# Patient Record
Sex: Female | Born: 1975 | Race: White | Hispanic: No | Marital: Married | Smoking: Current some day smoker
Health system: Southern US, Community
[De-identification: ages and names within clinical notes are randomized; demographics above are authoritative.]

## PROBLEM LIST (undated history)

## (undated) DIAGNOSIS — E669 Obesity, unspecified: Secondary | ICD-10-CM

## (undated) DIAGNOSIS — Z9109 Other allergy status, other than to drugs and biological substances: Secondary | ICD-10-CM

## (undated) DIAGNOSIS — E119 Type 2 diabetes mellitus without complications: Secondary | ICD-10-CM

## (undated) DIAGNOSIS — E785 Hyperlipidemia, unspecified: Secondary | ICD-10-CM

## (undated) DIAGNOSIS — F319 Bipolar disorder, unspecified: Secondary | ICD-10-CM

## (undated) DIAGNOSIS — F419 Anxiety disorder, unspecified: Secondary | ICD-10-CM

## (undated) DIAGNOSIS — Z87442 Personal history of urinary calculi: Secondary | ICD-10-CM

## (undated) DIAGNOSIS — F3181 Bipolar II disorder: Secondary | ICD-10-CM

## (undated) DIAGNOSIS — G5600 Carpal tunnel syndrome, unspecified upper limb: Secondary | ICD-10-CM

## (undated) DIAGNOSIS — C539 Malignant neoplasm of cervix uteri, unspecified: Secondary | ICD-10-CM

## (undated) DIAGNOSIS — K219 Gastro-esophageal reflux disease without esophagitis: Secondary | ICD-10-CM

## (undated) DIAGNOSIS — M1990 Unspecified osteoarthritis, unspecified site: Secondary | ICD-10-CM

## (undated) DIAGNOSIS — J189 Pneumonia, unspecified organism: Secondary | ICD-10-CM

## (undated) DIAGNOSIS — G4730 Sleep apnea, unspecified: Secondary | ICD-10-CM

## (undated) HISTORY — PX: WISDOM TOOTH EXTRACTION: SHX21

## (undated) HISTORY — PX: DILATION AND CURETTAGE OF UTERUS: SHX78

## (undated) HISTORY — PX: ABDOMINAL HYSTERECTOMY: SHX81

## (undated) HISTORY — PX: TONSILLECTOMY: SUR1361

---

## 2006-05-11 ENCOUNTER — Emergency Department: Payer: Self-pay | Admitting: Emergency Medicine

## 2006-08-24 ENCOUNTER — Emergency Department: Payer: Self-pay | Admitting: Emergency Medicine

## 2006-12-13 ENCOUNTER — Emergency Department: Payer: Self-pay | Admitting: Emergency Medicine

## 2007-01-20 ENCOUNTER — Emergency Department: Payer: Self-pay | Admitting: Emergency Medicine

## 2007-08-17 ENCOUNTER — Emergency Department: Payer: Self-pay | Admitting: Unknown Physician Specialty

## 2007-08-17 ENCOUNTER — Other Ambulatory Visit: Payer: Self-pay

## 2007-08-25 ENCOUNTER — Emergency Department: Payer: Self-pay | Admitting: Emergency Medicine

## 2007-09-15 ENCOUNTER — Ambulatory Visit: Payer: Self-pay | Admitting: Unknown Physician Specialty

## 2007-10-05 ENCOUNTER — Emergency Department: Payer: Self-pay | Admitting: Internal Medicine

## 2007-10-26 ENCOUNTER — Emergency Department: Payer: Self-pay | Admitting: Emergency Medicine

## 2007-11-10 ENCOUNTER — Ambulatory Visit: Payer: Self-pay | Admitting: Otolaryngology

## 2008-01-27 ENCOUNTER — Ambulatory Visit: Payer: Self-pay | Admitting: Rheumatology

## 2008-02-03 ENCOUNTER — Encounter: Payer: Self-pay | Admitting: Rheumatology

## 2008-05-17 ENCOUNTER — Emergency Department: Payer: Self-pay | Admitting: Emergency Medicine

## 2008-06-04 ENCOUNTER — Emergency Department: Payer: Self-pay | Admitting: Emergency Medicine

## 2008-08-04 ENCOUNTER — Ambulatory Visit: Payer: Self-pay | Admitting: Gastroenterology

## 2008-11-18 ENCOUNTER — Emergency Department: Payer: Self-pay | Admitting: Emergency Medicine

## 2009-01-20 ENCOUNTER — Emergency Department: Payer: Self-pay | Admitting: Emergency Medicine

## 2009-05-14 ENCOUNTER — Emergency Department: Payer: Self-pay | Admitting: Emergency Medicine

## 2009-06-06 ENCOUNTER — Ambulatory Visit: Payer: Self-pay | Admitting: Family Medicine

## 2009-10-20 ENCOUNTER — Emergency Department: Payer: Self-pay | Admitting: Unknown Physician Specialty

## 2011-03-30 ENCOUNTER — Emergency Department: Payer: Self-pay | Admitting: Emergency Medicine

## 2012-01-19 ENCOUNTER — Emergency Department: Payer: Self-pay | Admitting: Unknown Physician Specialty

## 2012-01-19 LAB — CBC
HCT: 48 % — ABNORMAL HIGH (ref 35.0–47.0)
HGB: 16.5 g/dL — ABNORMAL HIGH (ref 12.0–16.0)
MCH: 31.6 pg (ref 26.0–34.0)
MCHC: 34.3 g/dL (ref 32.0–36.0)
Platelet: 233 10*3/uL (ref 150–440)
RBC: 5.2 10*6/uL (ref 3.80–5.20)
RDW: 13.4 % (ref 11.5–14.5)

## 2012-01-19 LAB — PROTIME-INR
INR: 0.9
Prothrombin Time: 12.6 secs (ref 11.5–14.7)

## 2012-01-23 ENCOUNTER — Ambulatory Visit: Payer: Self-pay | Admitting: Family Medicine

## 2012-06-11 ENCOUNTER — Emergency Department: Payer: Self-pay | Admitting: Emergency Medicine

## 2012-10-20 ENCOUNTER — Ambulatory Visit: Payer: Self-pay | Admitting: Neurology

## 2012-10-28 ENCOUNTER — Ambulatory Visit: Payer: Self-pay | Admitting: Neurology

## 2013-04-02 ENCOUNTER — Ambulatory Visit: Payer: Self-pay | Admitting: Orthopedic Surgery

## 2013-04-03 HISTORY — PX: CARPAL TUNNEL RELEASE: SHX101

## 2013-09-29 ENCOUNTER — Ambulatory Visit: Payer: Self-pay | Admitting: Orthopedic Surgery

## 2014-03-12 ENCOUNTER — Emergency Department: Payer: Self-pay | Admitting: Emergency Medicine

## 2014-05-20 DIAGNOSIS — G5600 Carpal tunnel syndrome, unspecified upper limb: Secondary | ICD-10-CM | POA: Insufficient documentation

## 2014-05-20 DIAGNOSIS — G4733 Obstructive sleep apnea (adult) (pediatric): Secondary | ICD-10-CM | POA: Insufficient documentation

## 2014-05-20 DIAGNOSIS — E119 Type 2 diabetes mellitus without complications: Secondary | ICD-10-CM | POA: Insufficient documentation

## 2014-07-03 ENCOUNTER — Emergency Department: Payer: Self-pay | Admitting: Emergency Medicine

## 2015-04-22 NOTE — Op Note (Signed)
PATIENT NAME:  Monica Dominguez, Monica Dominguez MR#:  [id number] DATE OF BIRTH:  07-Aug-1976  DATE OF PROCEDURE:  04/02/2013  PREOPERATIVE DIAGNOSIS:  Right carpal tunnel syndrome.   POSTOPERATIVE DIAGNOSIS:  Right carpal tunnel syndrome.   PROCEDURE:  Right carpal tunnel release.   ANESTHESIA:  General.  SURGEON:  Laurene Footman, M.D.   DESCRIPTION OF PROCEDURE:  The patient was brought to the operating room, and after adequate anesthesia was obtained, the right arm was prepped and draped in the usual sterile fashion with a tourniquet applied to the upper arm. After patient identification and timeout procedures were completed, the tourniquet raised to 250 mmHg. Approximately a 2 cm incision was made in line with the ring metacarpal, and the skin and subcutaneous tissue were spread. Transcarpal ligament was identified. There was prominent thenar musculature overlying this transcarpal ligament. A small nick was made into the transcarpal ligament, and a hemostat placed to protect the underlying structures. Release was carried out distally and then proximally through the ligament and with direct visualization of the nerve. The nerve was noted to be compressed at the level of proximal wrist flexion crease. After release, there was good vascular blush. There were no masses, and there was mild flexor tenosynovitis. The wrist was then irrigated and closed with simple interrupted 4-0 nylon. Xeroform, 4 x 4's, Webril and Ace wrap applied.   SPECIMEN:  None.  COMPLICATIONS:  None.  ESTIMATED BLOOD LOSS:  Minimal.   Tourniquet time was 11 minutes at 250 mmHg.   ____________________________ Laurene Footman, MD mjm:dmm D: 04/02/2013 22:30:15 ET T: 04/02/2013 23:05:49 ET JOB#: [id number]  cc: Laurene Footman, MD, <Dictator> Laurene Footman MD ELECTRONICALLY SIGNED 04/03/2013 8:25

## 2015-04-22 NOTE — Op Note (Signed)
PATIENT NAME:  Monica Dominguez, Monica Dominguez MR#:  [id number] DATE OF BIRTH:  [birth year]  DATE OF PROCEDURE:  09/29/2013  PREOPERATIVE DIAGNOSIS:  Right de Quervain's stenosing tenosynovitis.  POSTOPERATIVE DIAGNOSIS:  Right de Quervain's stenosing tenosynovitis.   PROCEDURE:  Harriet Pho release, right wrist.   ANESTHESIA:  General.   SURGEON:  Laurene Footman, MD   DESCRIPTION OF PROCEDURE:  The patient was brought to the operating room, and after adequate anesthesia was obtained, the right arm was prepped and draped in the usual sterile fashion with a tourniquet applied to the upper arm. After patient identification and timeout procedures were completed, the tourniquet was raised to 250 mmHg. A small transverse incision was made in line with the wrist crease at the level of the styloid. The superficial skin was incised, and the subcutaneous tissue was spread preserving the subcutaneous nerves. The area of compression was identified, and this was opened with a deep knife, followed by scissors. Multiple slips of the tendons were identified, and a complete release was carried out proximally and distally without subluxation of the tendons. There was a significant amount of fluid on initially opening up de Quervain's canal. Following this release, the thumb was placed through a range of motion. Tendons were intact, and on palpation, there was no bony spur palpable. The wound was then closed using simple interrupted 4-0 nylon skin sutures with 10 mL 0.5% Sensorcaine infiltrated proximal to the incision for postoperative analgesia. A sterile compressive dressing with Xeroform, 4 x 4's, Webril, and Ace wrap was applied and the tourniquet let down.   TOURNIQUET TIME:  9 minutes at 250 mmHg.    COMPLICATIONS:  None.   SPECIMEN:  None.   ESTIMATED BLOOD LOSS:  Minimal.      ____________________________ Laurene Footman, MD mjm:ms D: 09/29/2013 21:38:45 ET T: 09/29/2013 22:20:58  ET JOB#: [id number]  cc: Laurene Footman, MD, <Dictator> Laurene Footman MD ELECTRONICALLY SIGNED 09/30/2013 7:57

## 2015-06-19 ENCOUNTER — Emergency Department
Admission: EM | Admit: 2015-06-19 | Discharge: 2015-06-19 | Disposition: A | Discharge status: Home / Self Care | Payer: Medicaid Other | Attending: Emergency Medicine | Admitting: Emergency Medicine

## 2015-06-19 ENCOUNTER — Encounter: Payer: Self-pay | Admitting: Emergency Medicine

## 2015-06-19 DIAGNOSIS — Z87891 Personal history of nicotine dependence: Secondary | ICD-10-CM | POA: Diagnosis not present

## 2015-06-19 DIAGNOSIS — R112 Nausea with vomiting, unspecified: Secondary | ICD-10-CM | POA: Insufficient documentation

## 2015-06-19 DIAGNOSIS — Z79899 Other long term (current) drug therapy: Secondary | ICD-10-CM | POA: Insufficient documentation

## 2015-06-19 DIAGNOSIS — M545 Low back pain, unspecified: Secondary | ICD-10-CM

## 2015-06-19 HISTORY — DX: Hyperlipidemia, unspecified: E78.5

## 2015-06-19 HISTORY — DX: Gastro-esophageal reflux disease without esophagitis: K21.9

## 2015-06-19 HISTORY — DX: Other allergy status, other than to drugs and biological substances: Z91.09

## 2015-06-19 LAB — URINALYSIS COMPLETE WITH MICROSCOPIC (ARMC ONLY)
BACTERIA UA: NONE SEEN
Bilirubin Urine: NEGATIVE
GLUCOSE, UA: 50 mg/dL — AB
Hgb urine dipstick: NEGATIVE
Leukocytes, UA: NEGATIVE
Nitrite: NEGATIVE
Protein, ur: 30 mg/dL — AB
Specific Gravity, Urine: 1.031 — ABNORMAL HIGH (ref 1.005–1.030)
pH: 6 (ref 5.0–8.0)

## 2015-06-19 MED ORDER — KETOROLAC TROMETHAMINE 60 MG/2ML IM SOLN
INTRAMUSCULAR | Status: AC
Start: 2015-06-19 — End: 2015-06-19
  Administered 2015-06-19: 60 mg via INTRAMUSCULAR
  Filled 2015-06-19: qty 2

## 2015-06-19 MED ORDER — KETOROLAC TROMETHAMINE 10 MG PO TABS: 10.0000 mg | ORAL_TABLET | Freq: Three times a day (TID) | ORAL | Status: DC

## 2015-06-19 MED ORDER — ONDANSETRON HCL 4 MG PO TABS: 4.0000 mg | ORAL_TABLET | Freq: Four times a day (QID) | ORAL | Status: DC | PRN

## 2015-06-19 MED ORDER — KETOROLAC TROMETHAMINE 60 MG/2ML IM SOLN
60.0000 mg | Freq: Once | INTRAMUSCULAR | Status: AC
  Administered 2015-06-19: 60 mg via INTRAMUSCULAR

## 2015-06-19 MED ORDER — ORPHENADRINE CITRATE 30 MG/ML IJ SOLN
60.0000 mg | INTRAMUSCULAR | Status: AC
  Administered 2015-06-19: 60 mg via INTRAMUSCULAR

## 2015-06-19 MED ORDER — ORPHENADRINE CITRATE 30 MG/ML IJ SOLN
INTRAMUSCULAR | Status: AC
  Administered 2015-06-19: 60 mg via INTRAMUSCULAR
  Filled 2015-06-19: qty 2

## 2015-06-19 NOTE — Discharge Instructions (Signed)
Back Pain, Adult Back pain is very common. The pain often gets better over time. The cause of back pain is usually not dangerous. Most people can learn to manage their back pain on their own.  HOME CARE   Stay active. Start with short walks on flat ground if you can. Try to walk farther each day.  Do not sit, drive, or stand in one place for more than 30 minutes. Do not stay in bed.  Do not avoid exercise or work. Activity can help your back heal faster.  Be careful when you bend or lift an object. Bend at your knees, keep the object close to you, and do not twist.  Sleep on a firm mattress. Lie on your side, and bend your knees. If you lie on your back, put a pillow under your knees.  Only take medicines as told by your doctor.  Put ice on the injured area.  Put ice in a plastic bag.  Place a towel between your skin and the bag.  Leave the ice on for 15-20 minutes, 03-04 times a day for the first 2 to 3 days. After that, you can switch between ice and heat packs.  Ask your doctor about back exercises or massage.  Avoid feeling anxious or stressed. Find good ways to deal with stress, such as exercise. GET HELP RIGHT AWAY IF:   Your pain does not go away with rest or medicine.  Your pain does not go away in 1 week.  You have new problems.  You do not feel well.  The pain spreads into your legs.  You cannot control when you poop (bowel movement) or pee (urinate).  Your arms or legs feel weak or lose feeling (numbness).  You feel sick to your stomach (nauseous) or throw up (vomit).  You have belly (abdominal) pain.  You feel like you may pass out (faint). MAKE SURE YOU:   Understand these instructions.  Will watch your condition.  Will get help right away if you are not doing well or get worse. Document Released: 06/04/2008 Document Revised: 03/10/2012 Document Reviewed: 04/20/2014 [facility] Patient Information 2015 [place], [place]. This information is not intended  to replace advice given to you by your health care provider. Make sure you discuss any questions you have with your health care provider.  Take the prescription meds as directed.  Follow-up with your provider as scheduled.  Apply ice to the back for control of symptoms. Return to the ED as discussed for increased abdominal pain, bloody urine, or inability to pass urine.

## 2015-06-19 NOTE — ED Notes (Signed)
Pt reports right lower back pain that goes around to groin that started yesterday. Denies urinary complaints Pt states she thought it was muscular but no relief Nsaids or Tylenol. Pt states she has just started on Pravastatin and Amoxicillin [place] for ear infection.

## 2015-06-19 NOTE — ED Provider Notes (Signed)
[facility] Emergency Department Provider Note ____________________________________________  Time seen: 1610  I have reviewed the triage vital signs and the nursing notes.  HISTORY  Chief Complaint  Back Pain  HPI Monica Dominguez is a 39 y.o. female one day complaint of right lower back pain with referral to the groin. She denies any urinary symptoms including dysuria, hematuria or incontinence. She denies relief with over-the-counter anti-inflammatories or Tylenol. She is currently taking amoxicillin for a ear infection and she denies any fever, chills, sweats, or diarrhea.She gives a remote history of kidney stones, more than 20 years ago.  Past Medical History  Diagnosis Date  . Hyperlipemia   . Environmental allergies   . GERD (gastroesophageal reflux disease)    There are no active problems to display for this patient.  Past Surgical History  Procedure Laterality Date  . Abdominal hysterectomy    . Tonsillectomy     Current Outpatient Rx  Name  Route  Sig  Dispense  Refill  . gabapentin (NEURONTIN) 300 MG capsule   Oral   Take 300 mg by mouth 3 (three) times daily.         . pravastatin (PRAVACHOL) 40 MG tablet   Oral   Take 40 mg by mouth daily.         Marland Kitchen ketorolac (TORADOL) 10 MG tablet   Oral   Take 1 tablet (10 mg total) by mouth every 8 (eight) hours.   15 tablet   0   . ondansetron (ZOFRAN) 4 MG tablet   Oral   Take 1 tablet (4 mg total) by mouth every 6 (six) hours as needed for nausea or vomiting.   15 tablet   0     Allergies Review of patient's allergies indicates no known allergies.  History reviewed. No pertinent family history.  Social History History  Substance Use Topics  . Smoking status: Former Research scientist (life sciences)  . Smokeless tobacco: Never Used  . Alcohol Use: No   Review of Systems  Constitutional: Negative for fever. Eyes: Negative for visual changes. ENT: Negative for sore throat. Cardiovascular: Negative  for chest pain. Respiratory: Negative for shortness of breath. Gastrointestinal: Reports one episode of n/v this morning. Negative for abdominal pain, constipation, and diarrhea. Genitourinary: Negative for dysuria, hematuria, retention, or urgency. Musculoskeletal: Positive for back pain. Skin: Negative for rash. Neurological: Negative for headaches, focal weakness or numbness. ____________________________________________  PHYSICAL EXAM:  VITAL SIGNS: ED Triage Vitals  Enc Vitals Group     BP 06/19/15 1355 156/94 mmHg     Pulse Rate 06/19/15 1355 102     Resp --      Temp 06/19/15 1355 98.4 F (36.9 C)     Temp Source 06/19/15 1355 Oral     SpO2 06/19/15 1355 95 %     Weight 06/19/15 1355 254 lb (115.214 kg)     Height 06/19/15 1355 5' (1.524 m)     Head Cir --      Peak Flow --      Pain Score 06/19/15 1410 10     Pain Loc --      Pain Edu? --      Excl. in [place]? --    Constitutional: Alert and oriented. Well appearing and in no distress. HEENT: Normocephalic and atraumatic.Conjunctivae are normal. PERRL. Normal extraocular movements. Cardiovascular: Normal rate, regular rhythm.  Respiratory: Normal respiratory effort. No wheezes/rales/rhonchi. Gastrointestinal: Soft and nontender. No distention. No rebound or guarding. Mild CVA tenderness on the  right.  Musculoskeletal: Nontender with normal range of motion in all extremities. Normal spinal inspection without deformity, spasm, or step-off.  Tender to light touch over the right LS region. Normal lumbar and Neurologic:  Normal gait without ataxia. Normal speech and language. No gross focal neurologic deficits are appreciated. Skin:  Skin is warm, dry and intact. No rash noted. Psychiatric: Mood and affect are normal. Patient exhibits appropriate insight and judgment. ____________________________________________    LABS (pertinent positives/negatives) Labs Reviewed  URINALYSIS COMPLETEWITH MICROSCOPIC ([place] ONLY) - Abnormal;  Notable for the following:    Color, Urine YELLOW (*)    APPearance CLEAR (*)    Glucose, UA 50 (*)    Ketones, ur TRACE (*)    Specific Gravity, Urine 1.031 (*)    Protein, ur 30 (*)    Squamous Epithelial / LPF 0-5 (*)    All other components within normal limits  ____________________________________________   RADIOLOGY deferred ____________________________________________  PROCEDURES  Toradol 60 mg IM Norflex 60 mg IM ____________________________________________  INITIAL IMPRESSION / ASSESSMENT AND PLAN / ED COURSE  Acute lower back pain that is reproducible on exam.  Discussed lack of clinical evidence supporting need to scan for kidney stones, due to normal urine, no urinary symptoms, and no other acute abdominal signs on exam.  Will defer imaging and treat acute pain with Toradol and Zofran.  Refer to primary provider as scheduled. Return to the ED for frank hematuria, dysuria, retention, or worsening back pain. ____________________________________________  FINAL CLINICAL IMPRESSION(S) / ED DIAGNOSES  Final diagnoses:  Right-sided low back pain without sciatica     Melvenia Needles, PA-C 06/19/15 [place], MD 06/19/15 [phone]

## 2015-12-22 ENCOUNTER — Other Ambulatory Visit: Payer: Self-pay | Admitting: Preventative Medicine

## 2015-12-22 DIAGNOSIS — Z1231 Encounter for screening mammogram for malignant neoplasm of breast: Secondary | ICD-10-CM

## 2016-01-05 ENCOUNTER — Ambulatory Visit: Payer: Medicaid Other | Attending: Preventative Medicine

## 2016-02-20 ENCOUNTER — Encounter (HOSPITAL_COMMUNITY): Payer: Self-pay

## 2016-02-20 ENCOUNTER — Emergency Department (HOSPITAL_COMMUNITY)
Admission: EM | Admit: 2016-02-20 | Discharge: 2016-02-20 | Disposition: A | Discharge status: Home / Self Care | Payer: Medicaid Other | Attending: Emergency Medicine | Admitting: Emergency Medicine

## 2016-02-20 DIAGNOSIS — R0602 Shortness of breath: Secondary | ICD-10-CM | POA: Diagnosis not present

## 2016-02-20 DIAGNOSIS — R42 Dizziness and giddiness: Secondary | ICD-10-CM | POA: Insufficient documentation

## 2016-02-20 DIAGNOSIS — R002 Palpitations: Secondary | ICD-10-CM

## 2016-02-20 DIAGNOSIS — R1110 Vomiting, unspecified: Secondary | ICD-10-CM | POA: Diagnosis not present

## 2016-02-20 DIAGNOSIS — Z87891 Personal history of nicotine dependence: Secondary | ICD-10-CM | POA: Insufficient documentation

## 2016-02-20 DIAGNOSIS — E119 Type 2 diabetes mellitus without complications: Secondary | ICD-10-CM | POA: Diagnosis not present

## 2016-02-20 DIAGNOSIS — K219 Gastro-esophageal reflux disease without esophagitis: Secondary | ICD-10-CM | POA: Diagnosis not present

## 2016-02-20 DIAGNOSIS — R12 Heartburn: Secondary | ICD-10-CM | POA: Insufficient documentation

## 2016-02-20 DIAGNOSIS — E785 Hyperlipidemia, unspecified: Secondary | ICD-10-CM | POA: Insufficient documentation

## 2016-02-20 DIAGNOSIS — R55 Syncope and collapse: Secondary | ICD-10-CM | POA: Insufficient documentation

## 2016-02-20 DIAGNOSIS — R000 Tachycardia, unspecified: Secondary | ICD-10-CM | POA: Diagnosis present

## 2016-02-20 DIAGNOSIS — Z79899 Other long term (current) drug therapy: Secondary | ICD-10-CM | POA: Diagnosis not present

## 2016-02-20 HISTORY — DX: Type 2 diabetes mellitus without complications: E11.9

## 2016-02-20 LAB — CBC WITH DIFFERENTIAL/PLATELET
BASOS ABS: 0.1 10*3/uL (ref 0.0–0.1)
Basophils Relative: 1 %
Eosinophils Absolute: 0.2 10*3/uL (ref 0.0–0.7)
Eosinophils Relative: 2 %
HEMATOCRIT: 44.8 % (ref 36.0–46.0)
Hemoglobin: 15.6 g/dL — ABNORMAL HIGH (ref 12.0–15.0)
LYMPHS PCT: 41 %
Lymphs Abs: 3.7 10*3/uL (ref 0.7–4.0)
MCH: 31.7 pg (ref 26.0–34.0)
MCHC: 34.8 g/dL (ref 30.0–36.0)
MCV: 91.1 fL (ref 78.0–100.0)
Monocytes Absolute: 0.8 10*3/uL (ref 0.1–1.0)
Monocytes Relative: 9 %
NEUTROS ABS: 4.3 10*3/uL (ref 1.7–7.7)
Neutrophils Relative %: 47 %
PLATELETS: 204 10*3/uL (ref 150–400)
RBC: 4.92 MIL/uL (ref 3.87–5.11)
RDW: 13.3 % (ref 11.5–15.5)
WBC: 8.9 10*3/uL (ref 4.0–10.5)

## 2016-02-20 LAB — BASIC METABOLIC PANEL
ANION GAP: 9 (ref 5–15)
BUN: 14 mg/dL (ref 6–20)
CO2: 25 mmol/L (ref 22–32)
Calcium: 9 mg/dL (ref 8.9–10.3)
Chloride: 109 mmol/L (ref 101–111)
Creatinine, Ser: 1.1 mg/dL — ABNORMAL HIGH (ref 0.44–1.00)
GFR calc Af Amer: >60 mL/min (ref >60)
Glucose, Bld: 145 mg/dL — ABNORMAL HIGH (ref 65–99)
POTASSIUM: 3.7 mmol/L (ref 3.5–5.1)
SODIUM: 143 mmol/L (ref 135–145)

## 2016-02-20 LAB — TROPONIN I: Troponin I: <0.03 ng/mL (ref <0.031)

## 2016-02-20 MED ORDER — ONDANSETRON HCL 4 MG/2ML IJ SOLN
4.0000 mg | Freq: Once | INTRAMUSCULAR | Status: AC
  Administered 2016-02-20: 4 mg via INTRAVENOUS
  Filled 2016-02-20: qty 2

## 2016-02-20 MED ORDER — SODIUM CHLORIDE 0.9 % IV BOLUS (SEPSIS)
1000.0000 mL | Freq: Once | INTRAVENOUS | Status: AC
  Administered 2016-02-20: 1000 mL via INTRAVENOUS

## 2016-02-20 NOTE — ED Notes (Signed)
Patient ambulated around nurses station without difficulty. Voices no complaints.

## 2016-02-20 NOTE — ED Notes (Signed)
Pt states she was walking around at work and became dizzy and her toes on her left foot was tingling. Pt was vomiting on arrival to ED.

## 2016-02-20 NOTE — Discharge Instructions (Signed)
Palpitations  A palpitation is the feeling that your heartbeat is irregular or is faster than normal. It may feel like your heart is fluttering or skipping a beat. Palpitations are usually not a serious problem. However, in some cases, you may need further medical evaluation.  CAUSES   Palpitations can be caused by:  · Smoking.  · Caffeine or other stimulants, such as diet pills or energy drinks.  · Alcohol.  · Stress and anxiety.  · Strenuous physical activity.  · Fatigue.  · Certain medicines.  · Heart disease, especially if you have a history of irregular heart rhythms (arrhythmias), such as atrial fibrillation, atrial flutter, or supraventricular tachycardia.  · An improperly working pacemaker or defibrillator.  DIAGNOSIS   To find the cause of your palpitations, your health care provider will take your medical history and perform a physical exam. Your health care provider may also have you take a test called an ambulatory electrocardiogram (ECG). An ECG records your heartbeat patterns over a 24-hour period. You may also have other tests, such as:  · Transthoracic echocardiogram (TTE). During echocardiography, sound waves are used to evaluate how blood flows through your heart.  · Transesophageal echocardiogram (TEE).  · Cardiac monitoring. This allows your health care provider to monitor your heart rate and rhythm in real time.  · Holter monitor. This is a portable device that records your heartbeat and can help diagnose heart arrhythmias. It allows your health care provider to track your heart activity for several days, if needed.  · Stress tests by exercise or by giving medicine that makes the heart beat faster.  TREATMENT   Treatment of palpitations depends on the cause of your symptoms and can vary greatly. Most cases of palpitations do not require any treatment other than time, relaxation, and monitoring your symptoms. Other causes, such as atrial fibrillation, atrial flutter, or supraventricular  tachycardia, usually require further treatment.  HOME CARE INSTRUCTIONS   · Avoid:    Caffeinated coffee, tea, soft drinks, diet pills, and energy drinks.    Chocolate.    Alcohol.  · Stop smoking if you smoke.  · Reduce your stress and anxiety. Things that can help you relax include:    A method of controlling things in your body, such as your heartbeats, with your mind (biofeedback).    Yoga.    Meditation.    Physical activity such as swimming, jogging, or walking.  · Get plenty of rest and sleep.  SEEK MEDICAL CARE IF:   · You continue to have a fast or irregular heartbeat beyond 24 hours.  · Your palpitations occur more often.  SEEK IMMEDIATE MEDICAL CARE IF:  · You have chest pain or shortness of breath.  · You have a severe headache.  · You feel dizzy or you faint.  MAKE SURE YOU:  · Understand these instructions.  · Will watch your condition.  · Will get help right away if you are not doing well or get worse.     This information is not intended to replace advice given to you by your health care provider. Make sure you discuss any questions you have with your health care provider.     Document Released: 12/14/2000 Document Revised: 12/22/2013 Document Reviewed: 02/15/2012  Elsevier Interactive Patient Education ©2016 Elsevier Inc[employer]      Holter Monitoring  A Holter monitor is a small device that is used to detect abnormal heart rhythms. It clips to your clothing and is connected by wires   to flat, sticky disks (electrodes) that attach to your chest. It is worn continuously for 24-48 hours.  HOME CARE INSTRUCTIONS  · Wear your Holter monitor at all times, even while exercising and sleeping, for as long as directed by your health care provider.  · Make sure that the Holter monitor is safely clipped to your clothing or close to your body as recommended by your health care provider.  · Do not get the monitor or wires wet.  · Do not put body lotion or moisturizer on your chest.  · Keep your skin clean.  · Keep a  diary of your daily activities, such as walking and doing chores. If you feel that your heartbeat is abnormal or that your heart is fluttering or skipping a beat:    Record what you are doing when it happens.    Record what time of day the symptoms occur.  · Return your Holter monitor as directed by your health care provider.  · Keep all follow-up visits as directed by your health care provider. This is important.  SEEK IMMEDIATE MEDICAL CARE IF:  · You feel lightheaded or you faint.  · You have trouble breathing.  · You feel pain in your chest, upper arm, or jaw.  · You feel sick to your stomach and your skin is pale, cool, or damp.  · You heartbeat feels unusual or abnormal.     This information is not intended to replace advice given to you by your health care provider. Make sure you discuss any questions you have with your health care provider.     Document Released: 09/14/2004 Document Revised: 01/07/2015 Document Reviewed: 07/26/2014  Elsevier Interactive Patient Education ©2016 Elsevier Inc.

## 2016-02-20 NOTE — ED Provider Notes (Signed)
CSN: [id number]     Arrival date & time 02/20/16  1403 History   First MD Initiated Contact with Patient 02/20/16 1412     Chief Complaint  Patient presents with  . Tachycardia    Patient is a 40 y.o. female presenting with palpitations. The history is provided by the patient.  Palpitations Palpitations quality:  Fast Onset quality:  Sudden Duration:  1 hour Timing:  Constant Progression:  Improving Chronicity:  New Context: anxiety and nicotine   Context: not caffeine and not illicit drugs   Relieved by:  None tried Worsened by:  Nothing Associated symptoms: shortness of breath and vomiting   Associated symptoms: no cough and no syncope   Associated symptoms comment:  Chest burning/heartburn  Risk factors: no heart disease, no hx of DVT and no hx of PE   patient was at work (she works at [employer]") and noted her heart was racing She went to pharmacy who told her to call 911 After traveling in ambulance she vomited but no vomiting/diarrhea prior EMS ride No fever/cough She had otherwise been well She reports heartburn but reports taking all of her meds for this today She has never had this before No h/o CAD/PE/DVT   Past Medical History  Diagnosis Date  . Hyperlipemia   . Environmental allergies   . GERD (gastroesophageal reflux disease)   . Diabetes mellitus without complication [facility]    Past Surgical History  Procedure Laterality Date  . Abdominal hysterectomy    . Tonsillectomy     No family history on file. Social History  Substance Use Topics  . Smoking status: Former Research scientist (life sciences)  . Smokeless tobacco: Never Used  . Alcohol Use: No   OB History    No data available     Review of Systems  Constitutional: Negative for fever.  Respiratory: Positive for shortness of breath. Negative for cough.   Cardiovascular: Positive for palpitations. Negative for syncope.  Gastrointestinal: Positive for vomiting. Negative for diarrhea.  Neurological: Positive for  syncope and light-headedness.  All other systems reviewed and are negative.     Allergies  Review of patient's allergies indicates no known allergies.  Home Medications   Prior to Admission medications   Medication Sig Start Date End Date Taking? Authorizing Provider  HYDROcodone-acetaminophen (NORCO/VICODIN) 5-325 MG tablet Take 2 tablets by mouth every 6 (six) hours as needed for moderate pain.   Yes Historical Provider, MD  omeprazole (PRILOSEC) 20 MG capsule Take 40 mg by mouth daily.   Yes Historical Provider, MD  gabapentin (NEURONTIN) 300 MG capsule Take 300 mg by mouth 3 (three) times daily. Reported on 02/20/2016    Historical Provider, MD  ketorolac (TORADOL) 10 MG tablet Take 1 tablet (10 mg total) by mouth every 8 (eight) hours. Patient not taking: Reported on 02/20/2016 06/19/15   Dannielle Karvonen Menshew, PA-C  ondansetron (ZOFRAN) 4 MG tablet Take 1 tablet (4 mg total) by mouth every 6 (six) hours as needed for nausea or vomiting. Patient not taking: Reported on 02/20/2016 06/19/15   Dannielle Karvonen Menshew, PA-C  pravastatin (PRAVACHOL) 40 MG tablet Take 40 mg by mouth daily. Reported on 02/20/2016    Historical Provider, MD   BP 125/65 mmHg  Pulse 114  Temp(Src) 98.1 F (36.7 C)  Resp 15  Ht 5\' 3"  (1.6 m)  Wt 109.77 kg  BMI 42.88 kg/m2  SpO2 95% Physical Exam CONSTITUTIONAL: Well developed/well nourished HEAD: Normocephalic/atraumatic EYES: EOMI/PERRL ENMT: Mucous membranes moist NECK:  supple no meningeal signs SPINE/BACK:entire spine nontender CV: S1/S2 noted, no murmurs/rubs/gallops noted LUNGS: Lungs are clear to auscultation bilaterally, no apparent distress ABDOMEN: soft, nontender, no rebound or guarding, bowel sounds noted throughout abdomen GU:no cva tenderness NEURO: Pt is awake/alert/appropriate, moves all extremitiesx4.  No facial droop.   EXTREMITIES: pulses normal/equal, full ROM SKIN: warm, color normal PSYCH: no abnormalities of mood noted, alert  and oriented to situation  ED Course  Procedures  Medications  sodium chloride 0.9 % bolus 1,000 mL (0 mLs Intravenous Stopped 02/20/16 1555)  ondansetron (ZOFRAN) injection 4 mg (4 mg Intravenous Given 02/20/16 1438)  sodium chloride 0.9 % bolus 1,000 mL (1,000 mLs Intravenous New Bag/Given 02/20/16 1557)    Pt well appearing She appears to be improving Labs pending HR improved No pleuritic pain to suggest acute PE 4:49 PM Pt improved Ambulates without difficulty No hypoxia to suggest PE Tachycardia improved Will d/c home Advised PCP followup as may benefit from wearing holter monitor  Labs Review Labs Reviewed  BASIC METABOLIC PANEL - Abnormal; Notable for the following:    Glucose, Bld 145 (*)    Creatinine, Ser 1.10 (*)    All other components within normal limits  CBC WITH DIFFERENTIAL/PLATELET - Abnormal; Notable for the following:    Hemoglobin 15.6 (*)    All other components within normal limits  TROPONIN I    I have personally reviewed and evaluated these  lab results as part of my medical decision-making.   EKG Interpretation   Date/Time:  Monday February 20 2016 14:07:43 EST Ventricular Rate:  116 PR Interval:  154 QRS Duration: 106 QT Interval:  342 QTC Calculation: 475 R Axis:   78 Text Interpretation:  Sinus tachycardia RSR' in V1 or V2, right VCD or RVH  Baseline wander in lead(s) V3 No significant change since last tracing  Confirmed by Christy Gentles  MD, Chianti Goh ([id number]) on 02/20/2016 2:13:54 PM      MDM   Final diagnoses:  Palpitations    Nursing notes including past medical history and social history reviewed and considered in documentation Labs/vital reviewed myself and considered during evaluation     Ripley Fraise, MD 02/20/16 1651

## 2016-03-13 ENCOUNTER — Other Ambulatory Visit: Payer: Self-pay | Admitting: Family Medicine

## 2016-03-13 ENCOUNTER — Ambulatory Visit
Admission: RE | Admit: 2016-03-13 | Discharge: 2016-03-13 | Disposition: A | Discharge status: Home / Self Care | Payer: Medicaid Other | Source: Ambulatory Visit | Attending: Family Medicine | Admitting: Family Medicine

## 2016-03-13 DIAGNOSIS — M25531 Pain in right wrist: Secondary | ICD-10-CM

## 2016-04-24 DIAGNOSIS — H409 Unspecified glaucoma: Secondary | ICD-10-CM | POA: Insufficient documentation

## 2016-04-24 DIAGNOSIS — K219 Gastro-esophageal reflux disease without esophagitis: Secondary | ICD-10-CM | POA: Insufficient documentation

## 2016-04-26 ENCOUNTER — Telehealth: Payer: Self-pay | Admitting: Hematology and Oncology

## 2016-04-26 NOTE — Telephone Encounter (Signed)
Referral from [facility]. Left message for patient re calling to schedule appointment.

## 2016-04-27 ENCOUNTER — Other Ambulatory Visit: Payer: Self-pay | Admitting: Adult Health

## 2016-04-27 DIAGNOSIS — Z1231 Encounter for screening mammogram for malignant neoplasm of breast: Secondary | ICD-10-CM

## 2016-04-29 ENCOUNTER — Encounter: Payer: Self-pay | Admitting: Emergency Medicine

## 2016-04-29 ENCOUNTER — Emergency Department: Payer: BLUE CROSS/BLUE SHIELD

## 2016-04-29 ENCOUNTER — Emergency Department
Admission: EM | Admit: 2016-04-29 | Discharge: 2016-04-29 | Disposition: A | Discharge status: Home / Self Care | Payer: BLUE CROSS/BLUE SHIELD | Attending: Emergency Medicine | Admitting: Emergency Medicine

## 2016-04-29 DIAGNOSIS — E785 Hyperlipidemia, unspecified: Secondary | ICD-10-CM | POA: Insufficient documentation

## 2016-04-29 DIAGNOSIS — R1011 Right upper quadrant pain: Secondary | ICD-10-CM | POA: Diagnosis not present

## 2016-04-29 DIAGNOSIS — Z87891 Personal history of nicotine dependence: Secondary | ICD-10-CM | POA: Insufficient documentation

## 2016-04-29 DIAGNOSIS — R112 Nausea with vomiting, unspecified: Secondary | ICD-10-CM | POA: Diagnosis not present

## 2016-04-29 DIAGNOSIS — E119 Type 2 diabetes mellitus without complications: Secondary | ICD-10-CM | POA: Diagnosis not present

## 2016-04-29 DIAGNOSIS — R197 Diarrhea, unspecified: Secondary | ICD-10-CM | POA: Diagnosis not present

## 2016-04-29 DIAGNOSIS — K760 Fatty (change of) liver, not elsewhere classified: Secondary | ICD-10-CM

## 2016-04-29 HISTORY — DX: Anxiety disorder, unspecified: F41.9

## 2016-04-29 LAB — COMPREHENSIVE METABOLIC PANEL
ALBUMIN: 4.2 g/dL (ref 3.5–5.0)
ALK PHOS: 90 U/L (ref 38–126)
ALT: 54 U/L (ref 14–54)
ANION GAP: 9 (ref 5–15)
AST: 28 U/L (ref 15–41)
BUN: 11 mg/dL (ref 6–20)
CALCIUM: 9.3 mg/dL (ref 8.9–10.3)
CHLORIDE: 109 mmol/L (ref 101–111)
CO2: 23 mmol/L (ref 22–32)
Creatinine, Ser: 0.65 mg/dL (ref 0.44–1.00)
GFR calc Af Amer: >60 mL/min (ref >60)
GFR calc non Af Amer: >60 mL/min (ref >60)
GLUCOSE: 148 mg/dL — AB (ref 65–99)
Potassium: 3.8 mmol/L (ref 3.5–5.1)
SODIUM: 141 mmol/L (ref 135–145)
Total Bilirubin: 0.8 mg/dL (ref 0.3–1.2)
Total Protein: 7.4 g/dL (ref 6.5–8.1)

## 2016-04-29 LAB — URINALYSIS COMPLETE WITH MICROSCOPIC (ARMC ONLY)
BILIRUBIN URINE: NEGATIVE
GLUCOSE, UA: NEGATIVE mg/dL
Hgb urine dipstick: NEGATIVE
KETONES UR: NEGATIVE mg/dL
Leukocytes, UA: NEGATIVE
Nitrite: NEGATIVE
Protein, ur: NEGATIVE mg/dL
Specific Gravity, Urine: 1.017 (ref 1.005–1.030)
pH: 6 (ref 5.0–8.0)

## 2016-04-29 LAB — CBC
HCT: 47 % (ref 35.0–47.0)
Hemoglobin: 16.4 g/dL — ABNORMAL HIGH (ref 12.0–16.0)
MCH: 31 pg (ref 26.0–34.0)
MCHC: 34.9 g/dL (ref 32.0–36.0)
MCV: 88.8 fL (ref 80.0–100.0)
PLATELETS: 215 10*3/uL (ref 150–440)
RBC: 5.3 MIL/uL — AB (ref 3.80–5.20)
RDW: 13 % (ref 11.5–14.5)
WBC: 9.7 10*3/uL (ref 3.6–11.0)

## 2016-04-29 LAB — LIPASE, BLOOD: LIPASE: 20 U/L (ref 11–51)

## 2016-04-29 LAB — TROPONIN I: Troponin I: <0.03 ng/mL (ref <0.031)

## 2016-04-29 MED ORDER — ONDANSETRON HCL 4 MG/2ML IJ SOLN
4.0000 mg | Freq: Once | INTRAMUSCULAR | Status: AC
  Administered 2016-04-29: 4 mg via INTRAVENOUS
  Filled 2016-04-29: qty 2

## 2016-04-29 MED ORDER — LORAZEPAM 2 MG/ML IJ SOLN
1.0000 mg | Freq: Once | INTRAMUSCULAR | Status: AC
  Administered 2016-04-29: 1 mg via INTRAVENOUS
  Filled 2016-04-29: qty 1

## 2016-04-29 MED ORDER — ONDANSETRON HCL 4 MG/2ML IJ SOLN
INTRAMUSCULAR | Status: AC
  Filled 2016-04-29: qty 2

## 2016-04-29 MED ORDER — PROMETHAZINE HCL 25 MG PO TABS: 25.0000 mg | ORAL_TABLET | Freq: Four times a day (QID) | ORAL | Status: DC | PRN

## 2016-04-29 MED ORDER — ONDANSETRON HCL 4 MG/2ML IJ SOLN
4.0000 mg | Freq: Once | INTRAMUSCULAR | Status: AC
  Administered 2016-04-29: 4 mg via INTRAVENOUS

## 2016-04-29 NOTE — ED Provider Notes (Signed)
[facility] Emergency Department Provider Note  Time seen: 11:20 AM  I have reviewed the triage vital signs and the nursing notes.   HISTORY  Chief Complaint Abdominal Pain; Emesis; and Diarrhea    HPI Monica Dominguez is a 40 y.o. female with a past medical history of gastric reflux, diabetes, presents the emergency department with nausea, vomiting, diarrhea and upper abdominal pain. According to the patient for the past 3 days she has been intermittently nauseated with vomiting and episodes of diarrhea. Also states intermittent right upper quadrant discomfort. Denies any known association with food. Patient states a history of fatty liver disease which has caused pain for her in the past. Denies any fever. Does state she has had some dark stool.     Past Medical History  Diagnosis Date  . Hyperlipemia   . Environmental allergies   . GERD (gastroesophageal reflux disease)   . Diabetes mellitus without complication ([place])   . Anxiety     There are no active problems to display for this patient.   Past Surgical History  Procedure Laterality Date  . Abdominal hysterectomy    . Tonsillectomy      Current Outpatient Rx  Name  Route  Sig  Dispense  Refill  . gabapentin (NEURONTIN) 300 MG capsule   Oral   Take 300 mg by mouth 3 (three) times daily. Reported on 02/20/2016         . HYDROcodone-acetaminophen (NORCO/VICODIN) 5-325 MG tablet   Oral   Take 2 tablets by mouth every 6 (six) hours as needed for moderate pain.         Marland Kitchen ketorolac (TORADOL) 10 MG tablet   Oral   Take 1 tablet (10 mg total) by mouth every 8 (eight) hours. Patient not taking: Reported on 02/20/2016   15 tablet   0   . omeprazole (PRILOSEC) 20 MG capsule   Oral   Take 40 mg by mouth daily.         . ondansetron (ZOFRAN) 4 MG tablet   Oral   Take 1 tablet (4 mg total) by mouth every 6 (six) hours as needed for nausea or vomiting. Patient not taking: Reported on  02/20/2016   15 tablet   0   . pravastatin (PRAVACHOL) 40 MG tablet   Oral   Take 40 mg by mouth daily. Reported on 02/20/2016           Allergies Review of patient's allergies indicates no known allergies.  No family history on file.  Social History Social History  Substance Use Topics  . Smoking status: Former Research scientist (life sciences)  . Smokeless tobacco: Never Used  . Alcohol Use: No    Review of Systems Constitutional: Negative for fever Cardiovascular: Negative for chest pain. Respiratory: Negative for shortness of breath. Gastrointestinal: Right upper quadrant pain. Positive for nausea, vomiting, diarrhea. Positive for dark stool. Genitourinary: Negative for dysuria.. Neurological: Negative for headache 10-point ROS otherwise negative.  ____________________________________________   PHYSICAL EXAM:   VITAL SIGNS: ED Triage Vitals  Enc Vitals Group     BP 04/29/16 0841 134/80 mmHg     Pulse Rate 04/29/16 0841 94     Resp 04/29/16 0841 18     Temp 04/29/16 0841 97.8 F (36.6 C)     Temp Source 04/29/16 0841 Oral     SpO2 04/29/16 0841 97 %     Weight 04/29/16 0841 249 lb (112.946 kg)     Height 04/29/16 0841 5\' 1"  (1.549  m)     Head Cir --      Peak Flow --      Pain Score 04/29/16 0841 9     Pain Loc --      Pain Edu? --      Excl. in [place]? --    Constitutional: Alert and oriented. Well appearing and in no distress. Eyes: Normal exam ENT   Head: Normocephalic and atraumatic.   Mouth/Throat: Mucous membranes are moist. Cardiovascular: Normal rate, regular rhythm. No murmur Respiratory: Normal respiratory effort without tachypnea nor retractions. Breath sounds are clear  Gastrointestinal: Soft, mild right upper quadrant tenderness palpation. No rebound or guarding. No distention. Musculoskeletal: Nontender with normal range of motion in all extremities.  Neurologic:  Normal speech and language. No gross focal neurologic deficits  Skin:  Skin is warm, dry and  intact.  Psychiatric: Mood and affect are normal.  ____________________________________________    EKG  EKG reviewed and interpreted thumb itself shows normal sinus rhythm at 87 bpm, narrow QRS, normal axis, normal intervals, RSR pattern was consistent right palm branch block. No ST changes.  ____________________________________________    RADIOLOGY  Right upper quadrant ultrasound within normal limits  ____________________________________________    INITIAL IMPRESSION / ASSESSMENT AND PLAN / ED COURSE  Pertinent labs & imaging results that were available during my care of the patient were reviewed by me and considered in my medical decision making (see chart for details).  Patient presents for intermittent nausea, vomiting, diarrhea. Patient's labs are reassuring. Anion gap of 9. Urinalysis does show bacteria the patient denies any dysuria. We'll cover with a short course of antibiotics. Patient does state dark stool. I'll perform a rectal exam to rule out bleed. Otherwise patient's ultrasound is normal, suspect a fatty liver disease to be the cause of the patient's discomfort.   Rectal exam shows dark brown stool. Guaiac-negative. Patient states she is feeling very anxious. Was prescribed Xanax by her primary care doctor but cannot keep them down due to nausea. Patient does appear quite anxious at this time. We will dose IV Ativan in the emergency Department. Otherwise the patient appears well with reassuring labs. We will discharge the patient home with Phenergan to be taken as needed for nausea.    ____________________________________________   FINAL CLINICAL IMPRESSION(S) / ED DIAGNOSES  Abdominal pain Nausea vomiting diarrhea   [name], MD 04/29/16 1233

## 2016-04-29 NOTE — Discharge Instructions (Signed)
Please take your Phenergan as needed, as written for nausea. Please follow-up with your primary care physician 1-2 days for recheck/reevaluation. Return to the emergency department for any acute or concerning symptoms.   Abdominal Pain, Adult Many things can cause abdominal pain. Usually, abdominal pain is not caused by a disease and will improve without treatment. It can often be observed and treated at home. Your health care provider will do a physical exam and possibly order blood tests and X-rays to help determine the seriousness of your pain. However, in many cases, more time must pass before a clear cause of the pain can be found. Before that point, your health care provider may not know if you need more testing or further treatment. HOME CARE INSTRUCTIONS Monitor your abdominal pain for any changes. The following actions may help to alleviate any discomfort you are experiencing:  Only take over-the-counter or prescription medicines as directed by your health care provider.  Do not take laxatives unless directed to do so by your health care provider.  Try a clear liquid diet (broth, tea, or water) as directed by your health care provider. Slowly move to a bland diet as tolerated. SEEK MEDICAL CARE IF:  You have unexplained abdominal pain.  You have abdominal pain associated with nausea or diarrhea.  You have pain when you urinate or have a bowel movement.  You experience abdominal pain that wakes you in the night.  You have abdominal pain that is worsened or improved by eating food.  You have abdominal pain that is worsened with eating fatty foods.  You have a fever. SEEK IMMEDIATE MEDICAL CARE IF:  Your pain does not go away within 2 hours.  You keep throwing up (vomiting).  Your pain is felt only in portions of the abdomen, such as the right side or the left lower portion of the abdomen.  You pass bloody or black tarry stools. MAKE SURE YOU:  Understand these  instructions.  Will watch your condition.  Will get help right away if you are not doing well or get worse.   This information is not intended to replace advice given to you by your health care provider. Make sure you discuss any questions you have with your health care provider.   Document Released: 09/26/2005 Document Revised: 09/07/2015 Document Reviewed: 08/26/2013 Elsevier Interactive Patient Education 2016 Elsevier Inc.  Nausea and Vomiting Nausea means you feel sick to your stomach. Throwing up (vomiting) is a reflex where stomach contents come out of your mouth. HOME CARE   Take medicine as told by your doctor.  Do not force yourself to eat. However, you do need to drink fluids.  If you feel like eating, eat a normal diet as told by your doctor.  Eat rice, wheat, potatoes, bread, lean meats, yogurt, fruits, and vegetables.  Avoid high-fat foods.  Drink enough fluids to keep your pee (urine) clear or pale yellow.  Ask your doctor how to replace body fluid losses (rehydrate). Signs of body fluid loss (dehydration) include:  Feeling very thirsty.  Dry lips and mouth.  Feeling dizzy.  Dark pee.  Peeing less than normal.  Feeling confused.  Fast breathing or heart rate. GET HELP RIGHT AWAY IF:   You have blood in your throw up.  You have black or bloody poop (stool).  You have a bad headache or stiff neck.  You feel confused.  You have bad belly (abdominal) pain.  You have chest pain or trouble breathing.  You do  not pee at least once every 8 hours.  You have cold, clammy skin.  You keep throwing up after 24 to 48 hours.  You have a fever. MAKE SURE YOU:   Understand these instructions.  Will watch your condition.  Will get help right away if you are not doing well or get worse.   This information is not intended to replace advice given to you by your health care provider. Make sure you discuss any questions you have with your health care  provider.   Document Released: 06/04/2008 Document Revised: 03/10/2012 Document Reviewed: 05/18/2011 Elsevier Interactive Patient Education [employer].

## 2016-04-29 NOTE — ED Notes (Signed)
Pt alert and oriented X4, active, cooperative, pt in NAD. RR even and unlabored, color WNL.  Pt informed to return if any life threatening symptoms occur.   

## 2016-04-29 NOTE — ED Notes (Signed)
With MD at bedside for rectal exam.

## 2016-04-29 NOTE — ED Notes (Signed)
Pt presents to ED with reports of abdominal pain, vomiting and diarrhea for three days. Pt reports RUQ abdominal pain.

## 2016-04-29 NOTE — ED Notes (Signed)
MD at bedside. 

## 2016-05-04 ENCOUNTER — Telehealth: Payer: Self-pay | Admitting: Hematology and Oncology

## 2016-05-04 ENCOUNTER — Encounter: Payer: Self-pay | Admitting: Genetic Counselor

## 2016-05-04 ENCOUNTER — Ambulatory Visit: Payer: Medicaid Other

## 2016-05-04 NOTE — Telephone Encounter (Signed)
Contacted referring and pt regarding future appt for genetics and onc

## 2016-05-07 ENCOUNTER — Other Ambulatory Visit: Payer: BLUE CROSS/BLUE SHIELD

## 2016-05-07 ENCOUNTER — Encounter: Payer: BLUE CROSS/BLUE SHIELD | Admitting: Genetic Counselor

## 2016-05-07 ENCOUNTER — Telehealth: Payer: Self-pay | Admitting: Genetic Counselor

## 2016-05-07 ENCOUNTER — Encounter: Payer: Self-pay | Admitting: Genetic Counselor

## 2016-05-07 NOTE — Telephone Encounter (Signed)
Pt called in to reschedule appt due to work schedule; new pt packet mailed

## 2016-05-08 ENCOUNTER — Ambulatory Visit
Admission: RE | Admit: 2016-05-08 | Discharge: 2016-05-08 | Disposition: A | Discharge status: Home / Self Care | Payer: BLUE CROSS/BLUE SHIELD | Source: Ambulatory Visit | Attending: Adult Health | Admitting: Adult Health

## 2016-05-08 DIAGNOSIS — Z1231 Encounter for screening mammogram for malignant neoplasm of breast: Secondary | ICD-10-CM | POA: Insufficient documentation

## 2016-05-09 ENCOUNTER — Encounter: Payer: BLUE CROSS/BLUE SHIELD | Admitting: Hematology and Oncology

## 2016-05-21 ENCOUNTER — Emergency Department
Admission: EM | Admit: 2016-05-21 | Discharge: 2016-05-21 | Disposition: A | Discharge status: Home / Self Care | Payer: BLUE CROSS/BLUE SHIELD | Attending: Emergency Medicine | Admitting: Emergency Medicine

## 2016-05-21 ENCOUNTER — Emergency Department: Payer: BLUE CROSS/BLUE SHIELD

## 2016-05-21 ENCOUNTER — Encounter: Payer: Self-pay | Admitting: Emergency Medicine

## 2016-05-21 DIAGNOSIS — Z79899 Other long term (current) drug therapy: Secondary | ICD-10-CM | POA: Diagnosis not present

## 2016-05-21 DIAGNOSIS — Z87891 Personal history of nicotine dependence: Secondary | ICD-10-CM | POA: Insufficient documentation

## 2016-05-21 DIAGNOSIS — E119 Type 2 diabetes mellitus without complications: Secondary | ICD-10-CM | POA: Diagnosis not present

## 2016-05-21 DIAGNOSIS — E785 Hyperlipidemia, unspecified: Secondary | ICD-10-CM | POA: Insufficient documentation

## 2016-05-21 DIAGNOSIS — R1084 Generalized abdominal pain: Secondary | ICD-10-CM | POA: Insufficient documentation

## 2016-05-21 DIAGNOSIS — K920 Hematemesis: Secondary | ICD-10-CM

## 2016-05-21 LAB — URINALYSIS COMPLETE WITH MICROSCOPIC (ARMC ONLY)
BILIRUBIN URINE: NEGATIVE
Glucose, UA: NEGATIVE mg/dL
Hgb urine dipstick: NEGATIVE
KETONES UR: NEGATIVE mg/dL
Leukocytes, UA: NEGATIVE
NITRITE: NEGATIVE
PROTEIN: NEGATIVE mg/dL
SPECIFIC GRAVITY, URINE: 1.016 (ref 1.005–1.030)
pH: 6 (ref 5.0–8.0)

## 2016-05-21 LAB — COMPREHENSIVE METABOLIC PANEL
ALK PHOS: 84 U/L (ref 38–126)
ALT: 44 U/L (ref 14–54)
ANION GAP: 6 (ref 5–15)
AST: 25 U/L (ref 15–41)
Albumin: 4.3 g/dL (ref 3.5–5.0)
BILIRUBIN TOTAL: 0.7 mg/dL (ref 0.3–1.2)
BUN: 15 mg/dL (ref 6–20)
CALCIUM: 9.3 mg/dL (ref 8.9–10.3)
CO2: 24 mmol/L (ref 22–32)
CREATININE: 0.72 mg/dL (ref 0.44–1.00)
Chloride: 109 mmol/L (ref 101–111)
GFR calc non Af Amer: >60 mL/min (ref >60)
GLUCOSE: 129 mg/dL — AB (ref 65–99)
Potassium: 3.7 mmol/L (ref 3.5–5.1)
Sodium: 139 mmol/L (ref 135–145)
TOTAL PROTEIN: 7.3 g/dL (ref 6.5–8.1)

## 2016-05-21 LAB — CBC
HCT: 46.4 % (ref 35.0–47.0)
HEMOGLOBIN: 15.9 g/dL (ref 12.0–16.0)
MCH: 30.4 pg (ref 26.0–34.0)
MCHC: 34.3 g/dL (ref 32.0–36.0)
MCV: 88.6 fL (ref 80.0–100.0)
PLATELETS: 200 10*3/uL (ref 150–440)
RBC: 5.23 MIL/uL — AB (ref 3.80–5.20)
RDW: 13 % (ref 11.5–14.5)
WBC: 7 10*3/uL (ref 3.6–11.0)

## 2016-05-21 LAB — LIPASE, BLOOD: Lipase: 26 U/L (ref 11–51)

## 2016-05-21 MED ORDER — ONDANSETRON 4 MG PO TBDP: 4.0000 mg | ORAL_TABLET | Freq: Three times a day (TID) | ORAL | Status: DC | PRN

## 2016-05-21 MED ORDER — DIATRIZOATE MEGLUMINE & SODIUM 66-10 % PO SOLN
15.0000 mL | Freq: Once | ORAL | Status: AC
  Administered 2016-05-21: 15 mL via ORAL

## 2016-05-21 MED ORDER — IOPAMIDOL (ISOVUE-300) INJECTION 61%
100.0000 mL | Freq: Once | INTRAVENOUS | Status: AC | PRN
  Administered 2016-05-21: 100 mL via INTRAVENOUS

## 2016-05-21 NOTE — ED Notes (Signed)
Pt refused pregnancy test. Pt states had a hysterectomy.

## 2016-05-21 NOTE — ED Notes (Signed)
Says she has vomited x 2 today.  Says it has pink blood in it now.  Also headache

## 2016-05-21 NOTE — ED Provider Notes (Signed)
[facility] Emergency Department Provider Note  ____________________________________________  Time seen: Approximately 2:15 PM  I have reviewed the triage vital signs and the nursing notes.   HISTORY  Chief Complaint Emesis and Hematemesis    HPI Monica Dominguez is a 40 y.o. female with a history of GERD, DM presenting with nausea, hematemesis, and diffuse abdominal pain. The patient reports that she was doing well until this morning when she began to have some nausea and had 2 episodes of "bright pink blood," emesis. She reports that she did not want to come to the emergency department "my husband made me." She has had some diffuse abdominal pain without any diarrhea or constipation. No fever or chills. No urinary symptoms. She does not have any lightheadedness or shortness of breath.   Past Medical History  Diagnosis Date  . Hyperlipemia   . Environmental allergies   . GERD (gastroesophageal reflux disease)   . Diabetes mellitus without complication ([place])   . Anxiety     There are no active problems to display for this patient.   Past Surgical History  Procedure Laterality Date  . Abdominal hysterectomy    . Tonsillectomy      Current Outpatient Rx  Name  Route  Sig  Dispense  Refill  . LORazepam (ATIVAN) 0.5 MG tablet   Oral   Take 0.5 mg by mouth every 8 (eight) hours.         . lovastatin (MEVACOR) 20 MG tablet   Oral   Take 20 mg by mouth every evening.         Marland Kitchen omeprazole (PRILOSEC) 20 MG capsule   Oral   Take 20 mg by mouth daily.          . ondansetron (ZOFRAN ODT) 4 MG disintegrating tablet   Oral   Take 1 tablet (4 mg total) by mouth every 8 (eight) hours as needed for nausea or vomiting.   20 tablet   0     Allergies Latex  Family History  Problem Relation Age of Onset  . Breast cancer Mother 13  . Breast cancer Maternal Grandmother 60  . Breast cancer Maternal Aunt 7  . Breast cancer Cousin     Social  History Social History  Substance Use Topics  . Smoking status: Former Research scientist (life sciences)  . Smokeless tobacco: Never Used  . Alcohol Use: No    Review of Systems Constitutional: No fever/chills.No lightheadedness or syncope. Eyes: No visual changes. ENT: No sore throat. No congestion or rhinorrhea. Cardiovascular: Denies chest pain. Denies palpitations. Respiratory: Denies shortness of breath.  No cough. Gastrointestinal: No abdominal pain.  Positive nausea, positive bloody vomiting.  No diarrhea.  No constipation. Genitourinary: Negative for dysuria. Musculoskeletal: Negative for back pain. Skin: Negative for rash. Neurological: Negative for headaches. No focal numbness, tingling or weakness.   10-point ROS otherwise negative.  ____________________________________________   PHYSICAL EXAM:  VITAL SIGNS: ED Triage Vitals  Enc Vitals Group     BP 05/21/16 0905 139/92 mmHg     Pulse Rate 05/21/16 0905 88     Resp 05/21/16 0905 14     Temp 05/21/16 0905 98.7 F (37.1 C)     Temp Source 05/21/16 0905 Oral     SpO2 05/21/16 0905 97 %     Weight 05/21/16 0905 247 lb (112.038 kg)     Height 05/21/16 0905 5\' 1"  (1.549 m)     Head Cir --      Peak Flow --  Pain Score 05/21/16 0907 9     Pain Loc --      Pain Edu? --      Excl. in [place]? --     Constitutional: Alert and oriented. Well appearing and in no acute distress. Answers questions appropriately. Eyes: Conjunctivae are normal.  EOMI. No scleral icterus. Head: Atraumatic. Nose: No congestion/rhinnorhea. Mouth/Throat: Mucous membranes are moist.  Neck: No stridor.  Supple.   Cardiovascular: Normal rate, regular rhythm. No murmurs, rubs or gallops.  Respiratory: Normal respiratory effort.  No accessory muscle use or retractions. Lungs CTAB.  No wheezes, rales or ronchi. Gastrointestinal: Obese. Soft and nondistended.  Diffuse abdominal tenderness without focality. No guarding or rebound.  No peritoneal signs. Musculoskeletal: No  LE edema. No ttp in the calves or palpable cords.  Negative Homan's sign. Neurologic:  A&Ox3.  Speech is clear.  Face and smile are symmetric.  EOMI.  Moves all extremities well. Skin:  Skin is warm, dry and intact. No rash noted. No pallor. Psychiatric: Mood and affect are normal. Speech and behavior are normal.  Normal judgement.  ____________________________________________   LABS (all labs ordered are listed, but only abnormal results are displayed)  Labs Reviewed  COMPREHENSIVE METABOLIC PANEL - Abnormal; Notable for the following:    Glucose, Bld 129 (*)    All other components within normal limits  CBC - Abnormal; Notable for the following:    RBC 5.23 (*)    All other components within normal limits  URINALYSIS COMPLETEWITH MICROSCOPIC ([facility] ONLY) - Abnormal; Notable for the following:    Color, Urine YELLOW (*)    APPearance CLEAR (*)    Bacteria, UA RARE (*)    Squamous Epithelial / LPF 0-5 (*)    All other components within normal limits  LIPASE, BLOOD  POC URINE PREG, ED   ____________________________________________  EKG  Not indicated ____________________________________________  RADIOLOGY  Ct Abdomen Pelvis W Contrast  05/21/2016  CLINICAL DATA:  Hematemesis. EXAM: CT ABDOMEN AND PELVIS WITH CONTRAST TECHNIQUE: Multidetector CT imaging of the abdomen and pelvis was performed using the standard protocol following bolus administration of intravenous contrast. CONTRAST:  122mL ISOVUE-300 IOPAMIDOL (ISOVUE-300) INJECTION 61% COMPARISON:  CT scan of November 18, 2008. FINDINGS: Visualized lung bases are unremarkable. No significant osseous abnormality is noted. No gallstones are noted. Fatty infiltration of the liver is noted with sparing around the gallbladder fossa. The spleen and pancreas unremarkable. Adrenal glands appear normal. Left kidney appears normal. 18 mm cyst is noted in midpole of right kidney. 9 mm cyst is noted in upper pole. Nonobstructive calculi are  noted in right kidney. No hydronephrosis or renal obstruction is noted. No ureteral calculi are noted. The appendix appears normal. There is no evidence of bowel obstruction. No abnormal fluid collection is noted. Urinary bladder appears normal. No significant adenopathy is noted. Status post hysterectomy. Ovaries are not well visualized. IMPRESSION: Fatty infiltration of the liver. Simple right renal cysts are noted. Nonobstructive right nephrolithiasis is noted. No hydronephrosis or renal obstruction is noted. Electronically Signed   By: Marijo Conception, M.D.   On: 05/21/2016 15:47    ____________________________________________   PROCEDURES  Procedure(s) performed: None  Critical Care performed: No ____________________________________________   INITIAL IMPRESSION / ASSESSMENT AND PLAN / ED COURSE  Pertinent labs & imaging results that were available during my care of the patient were reviewed by me and considered in my medical decision making (see chart for details).  40 y.o. female with a history of  GERD presenting with 2 episodes of pink vomitus and diffuse nonfocal abdominal pain. Her vital signs are reassuring, and she does not have evidence of dehydration. She is afebrile. The patient has reassuring lab work with a normal H&H, normal electrolytes. However given the amount of discomfort she has a my exam, I'll do a CT scan to rule out any acute abdominal pathology. In the meantime, we'll initiate immediate symptomatic therapy and reevaluate the patient for final disposition.  ----------------------------------------- 3:22 PM on 05/21/2016 -----------------------------------------  The patient's NG lavage was negative for any acute blood. The patient's CT scan does not show any acute process. We will plan to discharge her home. She understands return precautions as well as follow-up instructions. ____________________________________________  FINAL CLINICAL IMPRESSION(S) / ED  DIAGNOSES  Final diagnoses:  Hematemesis with nausea  Diffuse abdominal pain      NEW MEDICATIONS STARTED DURING THIS VISIT:  New Prescriptions   ONDANSETRON (ZOFRAN ODT) 4 MG DISINTEGRATING TABLET    Take 1 tablet (4 mg total) by mouth every 8 (eight) hours as needed for nausea or vomiting.     Eula Listen, MD 05/21/16 [phone]

## 2016-05-21 NOTE — Discharge Instructions (Signed)
Please take a bland BRAT diet to decrease nausea and vomiting. You may also take Zofran for nausea. For pain, please take Tylenol.  Return to the emergency department if you develop severe pain, inability keep down fluids, fever, or any other symptoms concerning to you.  Abdominal Pain, Adult Many things can cause abdominal pain. Usually, abdominal pain is not caused by a disease and will improve without treatment. It can often be observed and treated at home. Your health care provider will do a physical exam and possibly order blood tests and X-rays to help determine the seriousness of your pain. However, in many cases, more time must pass before a clear cause of the pain can be found. Before that point, your health care provider may not know if you need more testing or further treatment. HOME CARE INSTRUCTIONS Monitor your abdominal pain for any changes. The following actions may help to alleviate any discomfort you are experiencing:  Only take over-the-counter or prescription medicines as directed by your health care provider.  Do not take laxatives unless directed to do so by your health care provider.  Try a clear liquid diet (broth, tea, or water) as directed by your health care provider. Slowly move to a bland diet as tolerated. SEEK MEDICAL CARE IF:  You have unexplained abdominal pain.  You have abdominal pain associated with nausea or diarrhea.  You have pain when you urinate or have a bowel movement.  You experience abdominal pain that wakes you in the night.  You have abdominal pain that is worsened or improved by eating food.  You have abdominal pain that is worsened with eating fatty foods.  You have a fever. SEEK IMMEDIATE MEDICAL CARE IF:  Your pain does not go away within 2 hours.  You keep throwing up (vomiting).  Your pain is felt only in portions of the abdomen, such as the right side or the left lower portion of the abdomen.  You pass bloody or black tarry  stools. MAKE SURE YOU:  Understand these instructions.  Will watch your condition.  Will get help right away if you are not doing well or get worse.   This information is not intended to replace advice given to you by your health care provider. Make sure you discuss any questions you have with your health care provider.   Document Released: 09/26/2005 Document Revised: 09/07/2015 Document Reviewed: 08/26/2013 Elsevier Interactive Patient Education [employer].

## 2016-05-21 NOTE — ED Notes (Signed)
NG tube placed right nare. Placement verified with air bolus and clear greenish return. 250 mL normal saline instilled for lavage. 250 mL clear fluid returned. NGT removed as per doctor order.

## 2016-05-30 ENCOUNTER — Other Ambulatory Visit: Payer: Self-pay | Admitting: Adult Health

## 2016-05-30 DIAGNOSIS — R51 Headache: Principal | ICD-10-CM

## 2016-05-30 DIAGNOSIS — R519 Headache, unspecified: Secondary | ICD-10-CM

## 2016-06-06 ENCOUNTER — Ambulatory Visit
Admission: RE | Admit: 2016-06-06 | Discharge: 2016-06-06 | Disposition: A | Discharge status: Home / Self Care | Payer: BLUE CROSS/BLUE SHIELD | Source: Ambulatory Visit | Attending: Adult Health | Admitting: Adult Health

## 2016-06-06 DIAGNOSIS — R51 Headache: Principal | ICD-10-CM

## 2016-06-06 DIAGNOSIS — R519 Headache, unspecified: Secondary | ICD-10-CM

## 2016-06-06 MED ORDER — GADOBENATE DIMEGLUMINE 529 MG/ML IV SOLN
20.0000 mL | Freq: Once | INTRAVENOUS | Status: AC | PRN
  Administered 2016-06-06: 20 mL via INTRAVENOUS

## 2016-06-12 ENCOUNTER — Other Ambulatory Visit: Payer: BLUE CROSS/BLUE SHIELD

## 2016-06-12 ENCOUNTER — Ambulatory Visit (HOSPITAL_BASED_OUTPATIENT_CLINIC_OR_DEPARTMENT_OTHER): Payer: BLUE CROSS/BLUE SHIELD | Admitting: Genetic Counselor

## 2016-06-12 ENCOUNTER — Encounter: Payer: Self-pay | Admitting: Genetic Counselor

## 2016-06-12 DIAGNOSIS — Z808 Family history of malignant neoplasm of other organs or systems: Secondary | ICD-10-CM | POA: Diagnosis not present

## 2016-06-12 DIAGNOSIS — Z801 Family history of malignant neoplasm of trachea, bronchus and lung: Secondary | ICD-10-CM

## 2016-06-12 DIAGNOSIS — Z8541 Personal history of malignant neoplasm of cervix uteri: Secondary | ICD-10-CM | POA: Diagnosis not present

## 2016-06-12 DIAGNOSIS — Z315 Encounter for genetic counseling: Secondary | ICD-10-CM

## 2016-06-12 DIAGNOSIS — Z8049 Family history of malignant neoplasm of other genital organs: Secondary | ICD-10-CM

## 2016-06-12 DIAGNOSIS — Z8371 Family history of colonic polyps: Secondary | ICD-10-CM

## 2016-06-12 DIAGNOSIS — Z803 Family history of malignant neoplasm of breast: Secondary | ICD-10-CM | POA: Insufficient documentation

## 2016-06-12 NOTE — Progress Notes (Signed)
REFERRING PROVIDER: Minette Headland, NP [address] [place], [place] [address]  PRIMARY PROVIDER:  Minette Headland, NP  PRIMARY REASON FOR VISIT:  1. Family history of breast cancer in mother   2. History of cervical cancer   3. Family history of lung cancer   4. Family history of cervical cancer      HISTORY OF PRESENT ILLNESS:   Ms. Podgorski, a 40 y.o. female, was seen for a [facility] cancer genetics consultation at the request of Charlestine Massed, NP due to a maternal family history of early-onset breast cancer.  Ms. Barbuto presents to clinic today to discuss the possibility of a hereditary predisposition to cancer, genetic testing, and to further clarify her future cancer risks, as well as potential cancer risks for family members.   At the age of 74, Ms. Lamphear was diagnosed with cervical cancer and also endometriosis. This was treated with total hysterectomy with bilateral salpingo-oophorectomy.  She reports no other history of cancer.   HORMONAL RISK FACTORS:  Menarche was at age 82.  First live birth at age 19.  OCP use for approximately 4 years.  Ovaries intact: no.  Hysterectomy: yes.  Menopausal status: postmenopausal.  HRT use: 0 years. Colonoscopy: yes; first and only colonoscopy was in 2013, at which time 3 polyps were found.   Mammogram within the last year: yes. Number of breast biopsies: 0. Up to date with pelvic exams:  n/a. Any excessive radiation exposure in the past:  no  Past Medical History  Diagnosis Date  . Hyperlipemia   . Environmental allergies   . GERD (gastroesophageal reflux disease)   . Diabetes mellitus without complication ([place])   . Anxiety     Past Surgical History  Procedure Laterality Date  . Abdominal hysterectomy    . Tonsillectomy      Social History   Social History  . Marital Status: Married    Spouse Name: N/A  . Number of Children: N/A  . Years of Education: N/A   Social History Main Topics  . Smoking  status: Former Smoker -- 1.00 packs/day for 10 years    Types: Cigarettes    Quit date: 12/31/2008  . Smokeless tobacco: Never Used     Comment: quit smoking "about 5-10 years ago" (06/12/2016)  . Alcohol Use: No  . Drug Use: No  . Sexual Activity: Not Asked   Other Topics Concern  . None   Social History Narrative     FAMILY HISTORY:  We obtained a detailed, 4-generation family history.  Significant diagnoses are listed below: Family History  Problem Relation Age of Onset  . Breast cancer Mother 23    s/p mastectomy  . Breast cancer Maternal Grandmother 60    dx. 64-65  . Breast cancer Maternal Aunt     dx. 39-40  . Breast cancer Cousin   . Colon polyps Father     approx 2 polyps  . Lung cancer Maternal Grandfather     d. 77; smoker  . Cervical cancer Paternal Grandmother     d. 35 or older  . Lung cancer Other     maternal great uncle (MGM's brother); cigar smoker  . Lung cancer Other     maternal great grandmother (MGM's mother); not a smoker    Ms. Shimmin has four children--two daughters and two sons--ages 15-23.  Her oldest son has a daughter of his own.  Ms. Cansler has no full siblings, but does have one maternal half  sister who is currently 56 and who is cancer-free.  Ms. Wilmott sister also has two sons and daughters.  Ms. Onken mother is currently 37; she has a history of breast cancer that was diagnosed at age 86 and was treated with a mastectomy.  Ms. Elenbaas father is currently 24.  He has no history of cancer, but he does have a history of approximately 2 colon polyps found on colonoscopy.  Ms. Hoots mother has one full sister and two full brothers.  Her sister is currently 41 and she has a history of breast cancer diagnosed at 40-40.  This sister has three sons, all of whom are cancer-free.  The two brothers are 51 and 13 and have not had cancer.  Ms. Staunton maternal grandmother died of Alzheimer's-related causes at 78.  She had a history  of breast cancer diagnosed at 34-65.  Ms. Crail grandmother had one sister and one brother and her brother was a cigar smoker and died of lung cancer.  Ms. Mundorf maternal great grandmother also died of lung cancer, though she was not a smoker.  Ms. Husk maternal grandfather died of lung cancer at 46; he was also a smoker.  She has no information for any other maternal great aunts/uncles or great grandparents.    Ms. Farabaugh father had one full sister and two full brothers.  His sister died of suicide at 19.  She had no children.  His two brothers are in their 48s and have not had cancer.  Ms. Tworek reports no history of cancer for her paternal first cousins.  Her paternal grandmother died of cervical cancer at age 6 or older.  She has no information for her paternal grandfather.  She reports no family history of genetic testing for hereditary cancer.  Patient's maternal ancestors are of [place] descent, and paternal ancestors are of [place] and Native American/[place] descent. There is no reported Ashkenazi Jewish ancestry. There is no known consanguinity.  GENETIC COUNSELING ASSESSMENT: Ellyn Rubiano is a 40 y.o. female with a family history of early-onset breast cancer which is somewhat suggestive of a hereditary breast cancer syndrome and predisposition to cancer. We, therefore, discussed and recommended the following at today's visit.   DISCUSSION: We reviewed the characteristics, features and inheritance patterns of hereditary cancer syndromes, particularly those caused by mutations within the BRCA1/2 genes. We also discussed genetic testing, including the appropriate family members to test, the process of testing, insurance coverage and turn-around-time for results. We discussed the implications of a negative, positive and/or variant of uncertain significant result. We recommended Ms. Son pursue genetic testing for the 20-gene Breast/Ovarian Cancer Panel through [facility].  The Breast/Ovarian Cancer Panel offered by [facility] Hope Pigeon, MD) includes sequencing and deletion/duplication analysis for the following 19 genes:  ATM, BARD1, BRCA1, BRCA2, BRIP1, CDH1, CHEK2, FANCC, MLH1, MSH2, MSH6, NBN, PALB2, PMS2, PTEN, RAD51C, RAD51D, TP53, and XRCC2.  This panel also includes deletion/duplication analysis (without sequencing) for one gene, EPCAM.   Based on Ms. Negrete's personal and family history of cancer, she meets medical criteria for genetic testing. Despite that she meets criteria, she may still have an out of pocket cost. We discussed that if her out of pocket cost for testing is over $100, the laboratory will call and confirm whether she wants to proceed with testing.  If the out of pocket cost of testing is less than $100 she will be billed by the genetic testing laboratory.   Based on the patient's personal and  family history, statistical models (IBIS/Tyrer-Cuzick and Baker Janus models)  and literature data were used to estimate her risk of developing breast cancer. These estimate her lifetime risk of developing breast cancer to be approximately 13.9% to 16.3%. This estimation does not take into account any genetic testing results.  The patient's lifetime breast cancer risk is a preliminary estimate based on available information using one of several models endorsed by the [employer] (ACS). The ACS recommends consideration of breast MRI screening as an adjunct to mammography for patients at high risk (defined as 20% or greater lifetime risk). A more detailed breast cancer risk assessment can be considered, if clinically indicated.   PLAN: After considering the risks, benefits, and limitations, Ms. Heitmeyer  provided informed consent to pursue genetic testing and the blood sample was sent to [facility] for analysis of the 20-gene Breast/Ovarian Cancer Panel. Results should be available within approximately 2-3 weeks' time, at  which point they will be disclosed by telephone to Ms. Slaydon, as will any additional recommendations warranted by these results. Ms. Gasser will receive a summary of her genetic counseling visit and a copy of her results once available. This information will also be available in Epic. We encouraged Ms. Kenyon to remain in contact with cancer genetics annually so that we can continuously update the family history and inform her of any changes in cancer genetics and testing that may be of benefit for her family. Ms. Royal questions were answered to her satisfaction today. Our contact information was provided should additional questions or concerns arise.  Thank you for the referral and allowing [place] to share in the care of your patient.   Jeanine Luz, MS, [facility] Certified Genetic Counselor [email or web address] Phone: [phone]  The patient was seen for a total of 55 minutes in face-to-face genetic counseling.  This patient was discussed with Drs. Magrinat, Lindi Adie and/or Burr Medico who agrees with the above.    _______________________________________________________________________ For Office Staff:  Number of people involved in session: 2 Was an Intern/ student involved with case: yes

## 2016-07-12 ENCOUNTER — Encounter: Payer: Self-pay | Admitting: *Deleted

## 2016-07-13 ENCOUNTER — Encounter: Payer: Self-pay | Admitting: Anesthesiology

## 2016-07-13 ENCOUNTER — Encounter
Admission: RE | Disposition: A | Discharge status: Home / Self Care | Payer: Self-pay | Source: Ambulatory Visit | Attending: Unknown Physician Specialty

## 2016-07-13 ENCOUNTER — Ambulatory Visit: Payer: BLUE CROSS/BLUE SHIELD | Admitting: Certified Registered Nurse Anesthetist

## 2016-07-13 ENCOUNTER — Ambulatory Visit
Admission: RE | Admit: 2016-07-13 | Discharge: 2016-07-13 | Disposition: A | Discharge status: Home / Self Care | Payer: BLUE CROSS/BLUE SHIELD | Source: Ambulatory Visit | Attending: Unknown Physician Specialty | Admitting: Unknown Physician Specialty

## 2016-07-13 DIAGNOSIS — K219 Gastro-esophageal reflux disease without esophagitis: Secondary | ICD-10-CM | POA: Diagnosis not present

## 2016-07-13 DIAGNOSIS — F419 Anxiety disorder, unspecified: Secondary | ICD-10-CM | POA: Insufficient documentation

## 2016-07-13 DIAGNOSIS — Z6841 Body Mass Index (BMI) 40.0 and over, adult: Secondary | ICD-10-CM | POA: Diagnosis not present

## 2016-07-13 DIAGNOSIS — Z90710 Acquired absence of both cervix and uterus: Secondary | ICD-10-CM | POA: Insufficient documentation

## 2016-07-13 DIAGNOSIS — E785 Hyperlipidemia, unspecified: Secondary | ICD-10-CM | POA: Diagnosis not present

## 2016-07-13 DIAGNOSIS — K319 Disease of stomach and duodenum, unspecified: Secondary | ICD-10-CM | POA: Diagnosis not present

## 2016-07-13 DIAGNOSIS — E669 Obesity, unspecified: Secondary | ICD-10-CM | POA: Insufficient documentation

## 2016-07-13 DIAGNOSIS — Z8049 Family history of malignant neoplasm of other genital organs: Secondary | ICD-10-CM | POA: Diagnosis not present

## 2016-07-13 DIAGNOSIS — Z803 Family history of malignant neoplasm of breast: Secondary | ICD-10-CM | POA: Insufficient documentation

## 2016-07-13 DIAGNOSIS — Z801 Family history of malignant neoplasm of trachea, bronchus and lung: Secondary | ICD-10-CM | POA: Diagnosis not present

## 2016-07-13 DIAGNOSIS — Z79899 Other long term (current) drug therapy: Secondary | ICD-10-CM | POA: Diagnosis not present

## 2016-07-13 DIAGNOSIS — R1310 Dysphagia, unspecified: Secondary | ICD-10-CM | POA: Insufficient documentation

## 2016-07-13 DIAGNOSIS — G4730 Sleep apnea, unspecified: Secondary | ICD-10-CM | POA: Diagnosis not present

## 2016-07-13 DIAGNOSIS — Z87891 Personal history of nicotine dependence: Secondary | ICD-10-CM | POA: Diagnosis not present

## 2016-07-13 DIAGNOSIS — E119 Type 2 diabetes mellitus without complications: Secondary | ICD-10-CM | POA: Diagnosis not present

## 2016-07-13 DIAGNOSIS — R12 Heartburn: Secondary | ICD-10-CM | POA: Insufficient documentation

## 2016-07-13 DIAGNOSIS — Z91040 Latex allergy status: Secondary | ICD-10-CM | POA: Insufficient documentation

## 2016-07-13 HISTORY — DX: Carpal tunnel syndrome, unspecified upper limb: G56.00

## 2016-07-13 HISTORY — DX: Obesity, unspecified: E66.9

## 2016-07-13 HISTORY — PX: ESOPHAGOGASTRODUODENOSCOPY (EGD) WITH PROPOFOL: SHX5813

## 2016-07-13 HISTORY — DX: Sleep apnea, unspecified: G47.30

## 2016-07-13 LAB — GLUCOSE, CAPILLARY: GLUCOSE-CAPILLARY: 105 mg/dL — AB (ref 65–99)

## 2016-07-13 SURGERY — ESOPHAGOGASTRODUODENOSCOPY (EGD) WITH PROPOFOL
Anesthesia: General

## 2016-07-13 MED ORDER — PROPOFOL 500 MG/50ML IV EMUL
INTRAVENOUS | Status: DC | PRN
  Administered 2016-07-13: 160 ug/kg/min via INTRAVENOUS

## 2016-07-13 MED ORDER — PROPOFOL 10 MG/ML IV BOLUS
INTRAVENOUS | Status: DC | PRN
  Administered 2016-07-13: 20 mg via INTRAVENOUS
  Administered 2016-07-13: 50 mg via INTRAVENOUS

## 2016-07-13 MED ORDER — GLYCOPYRROLATE 0.2 MG/ML IJ SOLN
INTRAMUSCULAR | Status: DC | PRN
  Administered 2016-07-13: 0.2 mg via INTRAVENOUS

## 2016-07-13 MED ORDER — SODIUM CHLORIDE 0.9 % IV SOLN
INTRAVENOUS | Status: DC
  Administered 2016-07-13: 13:00:00 via INTRAVENOUS

## 2016-07-13 MED ORDER — FENTANYL CITRATE (PF) 100 MCG/2ML IJ SOLN
INTRAMUSCULAR | Status: DC | PRN
  Administered 2016-07-13: 50 ug via INTRAVENOUS

## 2016-07-13 MED ORDER — LIDOCAINE HCL (CARDIAC) 20 MG/ML IV SOLN
INTRAVENOUS | Status: DC | PRN
  Administered 2016-07-13: 100 mg via INTRAVENOUS

## 2016-07-13 NOTE — Transfer of Care (Signed)
Immediate Anesthesia Transfer of Care Note  Patient: Monica Dominguez  Procedure(s) Performed: Procedure(s): ESOPHAGOGASTRODUODENOSCOPY (EGD) WITH PROPOFOL (N/A)  Patient Location: PACU  Anesthesia Type:General  Level of Consciousness: awake and alert   Airway & Oxygen Therapy: Patient Spontanous Breathing and Patient connected to nasal cannula oxygen  Post-op Assessment: Report given to RN and Post -op Vital signs reviewed and stable  Post vital signs: Reviewed and stable  Last Vitals:  Filed Vitals:   07/13/16 1219  BP: 139/90  Temp: 36.9 C  Resp: 20    Last Pain: There were no vitals filed for this visit.       Complications: No apparent anesthesia complications

## 2016-07-13 NOTE — Op Note (Signed)
[facility] Gastroenterology Patient Name: Monica Dominguez Procedure Date: 07/13/2016 12:25 PM MRN: [id number] Account #: [id number] Date of Birth: 1976-02-23 Admit Type: Outpatient Age: 40 Room: [facility] ENDO ROOM 3 Gender: Female Note Status: Finalized Procedure:            Upper GI endoscopy Indications:          Epigastric abdominal pain, Heartburn, Suspected                        gastro-esophageal reflux disease Providers:            Manya Silvas, MD Referring MD:         Minette Headland (Referring MD) Medicines:            Propofol per Anesthesia Complications:        No immediate complications. Procedure:            Pre-Anesthesia Assessment:                       - After reviewing the risks and benefits, the patient                        was deemed in satisfactory condition to undergo the                        procedure.                       After obtaining informed consent, the endoscope was                        passed under direct vision. Throughout the procedure,                        the patient's blood pressure, pulse, and oxygen                        saturations were monitored continuously. The Endoscope                        was introduced through the mouth, and advanced to the                        second part of duodenum. The upper GI endoscopy was                        accomplished without difficulty. The patient tolerated                        the procedure well. Findings:      The examined esophagus was normal. GEJ 40CM.      Diffuse mildly erythematous mucosa without bleeding was found in the       gastric body and in the gastric antrum. Biopsies were taken with a cold       forceps for histology. Biopsies were taken with a cold forceps for       Helicobacter pylori testing.      Diffuse mildly erythematous mucosa without active bleeding and with no       stigmata of bleeding was found in the duodenal bulb. Impression:            -  Normal esophagus.                       - Erythematous mucosa in the gastric body and antrum.                        Biopsied.                       - Normal examined duodenum. Recommendation:       - Await pathology results. Take medicine. Stat Carafate                        slurry. Manya Silvas, MD 07/13/2016 1:07:27 PM This report has been signed electronically. Number of Addenda: 0 Note Initiated On: 07/13/2016 12:25 PM      [facility]

## 2016-07-13 NOTE — H&P (Signed)
Primary Care Physician:  Minette Headland, NP Primary Gastroenterologist:  Dr. Vira Agar  Pre-Procedure History & Physical: HPI:  Monica Dominguez is a 40 y.o. female is here for an endoscopy.   Past Medical History  Diagnosis Date  . Hyperlipemia   . Environmental allergies   . GERD (gastroesophageal reflux disease)   . Diabetes mellitus without complication ([place])   . Anxiety   . CTS (carpal tunnel syndrome)   . Sleep apnea   . Obesity     Past Surgical History  Procedure Laterality Date  . Abdominal hysterectomy    . Tonsillectomy    . Carpal tunnel release Right 04/03/2013    Prior to Admission medications   Medication Sig Start Date End Date Taking? Authorizing Provider  lovastatin (MEVACOR) 20 MG tablet Take 20 mg by mouth every evening.   Yes Historical Provider, MD  pantoprazole (PROTONIX) 40 MG tablet Take 40 mg by mouth 2 (two) times daily before a meal.   Yes Historical Provider, MD  cetirizine (ZYRTEC) 10 MG tablet Take 10 mg by mouth daily. Reported on 07/13/2016    Historical Provider, MD  LORazepam (ATIVAN) 0.5 MG tablet Take 0.5 mg by mouth every 8 (eight) hours. Reported on 07/13/2016    Historical Provider, MD  Olopatadine HCl (PAZEO) 0.7 % SOLN Apply 1 drop to eye daily. Reported on 07/13/2016    Historical Provider, MD  omeprazole (PRILOSEC) 20 MG capsule Take 20 mg by mouth daily. Reported on 07/13/2016    Historical Provider, MD  ondansetron (ZOFRAN ODT) 4 MG disintegrating tablet Take 1 tablet (4 mg total) by mouth every 8 (eight) hours as needed for nausea or vomiting. Patient not taking: Reported on 07/13/2016 05/21/16   Eula Listen, MD    Allergies as of 07/09/2016 - Review Complete 05/21/2016  Allergen Reaction Noted  . Latex Hives 05/21/2016    Family History  Problem Relation Age of Onset  . Breast cancer Mother 68    s/p mastectomy  . Breast cancer Maternal Grandmother 60    dx. 64-65  . Breast cancer Maternal Aunt     dx. 39-40  .  Breast cancer Cousin   . Colon polyps Father     approx 2 polyps  . Lung cancer Maternal Grandfather     d. 67; smoker  . Cervical cancer Paternal Grandmother     d. 84 or older  . Lung cancer Other     maternal great uncle (MGM's brother); cigar smoker  . Lung cancer Other     maternal great grandmother (MGM's mother); not a smoker    Social History   Social History  . Marital Status: Married    Spouse Name: N/A  . Number of Children: N/A  . Years of Education: N/A   Occupational History  . Not on file.   Social History Main Topics  . Smoking status: Former Smoker -- 1.00 packs/day for 10 years    Types: Cigarettes    Quit date: 12/31/2008  . Smokeless tobacco: Never Used     Comment: quit smoking "about 5-10 years ago" (06/12/2016)  . Alcohol Use: No  . Drug Use: No  . Sexual Activity: Not on file   Other Topics Concern  . Not on file   Social History Narrative    Review of Systems: See HPI, otherwise negative ROS  Physical Exam: BP 110/75 mmHg  Pulse 68  Temp(Src) 96.7 F (35.9 C) (Tympanic)  Resp 17  Ht 5\' 2"  (1.575  m)  Wt 111.585 kg (246 lb)  BMI 44.98 kg/m2  SpO2 98% General:   Alert,  pleasant and cooperative in NAD Head:  Normocephalic and atraumatic. Neck:  Supple; no masses or thyromegaly. Lungs:  Clear throughout to auscultation.    Heart:  Regular rate and rhythm. Abdomen:  Soft, nontender and nondistended. Normal bowel sounds, without guarding, and without rebound.   Neurologic:  Alert and  oriented x4;  grossly normal neurologically.  Impression/Plan: [name] is here for an endoscopy to be performed for epigastric abd pain  Risks, benefits, limitations, and alternatives regarding  endoscopy have been reviewed with the patient.  Questions have been answered.  All parties agreeable.   Gaylyn Cheers, MD  07/13/2016, 5:17 PM

## 2016-07-13 NOTE — Anesthesia Postprocedure Evaluation (Signed)
Anesthesia Post Note  Patient: Monica Dominguez  Procedure(s) Performed: Procedure(s) (LRB): ESOPHAGOGASTRODUODENOSCOPY (EGD) WITH PROPOFOL (N/A)  Patient location during evaluation: Endoscopy Anesthesia Type: General Level of consciousness: awake and alert Pain management: pain level controlled Vital Signs Assessment: post-procedure vital signs reviewed and stable Respiratory status: spontaneous breathing, nonlabored ventilation, respiratory function stable and patient connected to nasal cannula oxygen Cardiovascular status: blood pressure returned to baseline and stable Postop Assessment: no signs of nausea or vomiting Anesthetic complications: no    Last Vitals:  Filed Vitals:   07/13/16 1330 07/13/16 1337  BP:  110/75  Pulse: 65 68  Temp:    Resp: 11 17    Last Pain: There were no vitals filed for this visit.               Martha Clan

## 2016-07-13 NOTE — Anesthesia Preprocedure Evaluation (Signed)
Anesthesia Evaluation  Patient identified by MRN, date of birth, ID band Patient awake    Reviewed: Allergy & Precautions, H&P , NPO status , Patient's Chart, lab work & pertinent test results, reviewed documented beta blocker date and time   History of Anesthesia Complications Negative for: history of anesthetic complications  Airway Mallampati: I  TM Distance: >3 FB Neck ROM: full    Dental no notable dental hx. (+) Missing, Partial Lower, Partial Upper, Poor Dentition   Pulmonary neg shortness of breath, sleep apnea , neg COPD, neg recent URI, former smoker,    Pulmonary exam normal breath sounds clear to auscultation       Cardiovascular Exercise Tolerance: Good negative cardio ROS Normal cardiovascular exam Rhythm:regular Rate:Normal     Neuro/Psych PSYCHIATRIC DISORDERS (Anxiety) negative neurological ROS     GI/Hepatic GERD  Medicated,NAFLD   Endo/Other  diabetesMorbid obesity  Renal/GU negative Renal ROS  negative genitourinary   Musculoskeletal   Abdominal   Peds  Hematology negative hematology ROS (+)   Anesthesia Other Findings Past Medical History:   Hyperlipemia                                                 Environmental allergies                                      GERD (gastroesophageal reflux disease)                       Diabetes mellitus without complication ([facility])                 Anxiety                                                      CTS (carpal tunnel syndrome)                                 Sleep apnea                                                  Obesity                                                      Reproductive/Obstetrics negative OB ROS                             Anesthesia Physical Anesthesia Plan  ASA: III  Anesthesia Plan: General   Post-op Pain Management:    Induction:   Airway Management Planned:   Additional Equipment:    Intra-op Plan:   Post-operative Plan:   Informed Consent: I have reviewed the patients History and Physical, chart, labs and  discussed the procedure including the risks, benefits and alternatives for the proposed anesthesia with the patient or authorized representative who has indicated his/her understanding and acceptance.   Dental Advisory Given  Plan Discussed with: Anesthesiologist, CRNA and Surgeon  Anesthesia Plan Comments:         Anesthesia Quick Evaluation

## 2016-07-15 ENCOUNTER — Encounter: Payer: Self-pay | Admitting: Unknown Physician Specialty

## 2016-07-17 LAB — SURGICAL PATHOLOGY

## 2016-08-10 ENCOUNTER — Other Ambulatory Visit: Payer: Self-pay | Admitting: Unknown Physician Specialty

## 2016-08-10 DIAGNOSIS — M79662 Pain in left lower leg: Secondary | ICD-10-CM

## 2016-08-13 ENCOUNTER — Ambulatory Visit
Admission: RE | Admit: 2016-08-13 | Discharge: 2016-08-13 | Disposition: A | Discharge status: Home / Self Care | Payer: BLUE CROSS/BLUE SHIELD | Source: Ambulatory Visit | Attending: Unknown Physician Specialty | Admitting: Unknown Physician Specialty

## 2016-08-13 DIAGNOSIS — M79605 Pain in left leg: Secondary | ICD-10-CM | POA: Insufficient documentation

## 2016-08-13 DIAGNOSIS — M79662 Pain in left lower leg: Secondary | ICD-10-CM

## 2016-09-05 ENCOUNTER — Encounter: Payer: Self-pay | Admitting: Dietician

## 2016-09-05 ENCOUNTER — Encounter: Payer: BLUE CROSS/BLUE SHIELD | Attending: Nurse Practitioner | Admitting: Dietician

## 2016-09-05 VITALS — Ht 62.0 in | Wt 248.7 lb

## 2016-09-05 DIAGNOSIS — E119 Type 2 diabetes mellitus without complications: Secondary | ICD-10-CM

## 2016-09-05 NOTE — Patient Instructions (Addendum)
Balance meals with protein, 2-4 servings of carbohydrate and free vegetables. Establish a meal plan of 3 meals, spaced 4-6 hours apart,  and 1-2 snacks. Add more fruits and vegetables. Include fruit or vegetable or both at meals. Measure out some portions of starchy foods especially pasta and rice, etc. Call [place] to set up appointment using coupon. Decrease intake of watermelon juice.

## 2016-09-05 NOTE — Progress Notes (Signed)
Medical Nutrition Therapy: Visit start time: 8:10   end time: 9:10 Assessment:  Diagnosis: Type 2 Diabetes, fatty liver Past medical history: anxiety, hyperlipidemia, GERD Psychosocial issues/ stress concerns: Patient rates her stress as "high" and indicates "ok" as to how well she is dealing with her stress. Preferred learning method:  . Auditory Current weight: 248.7 lbs  Height: 62 in Medications, supplements: see list Progress and evaluation:  Patient in for initial medical nutrition assessment appointment. She reports an approximate 90 lb weight gain in past 2 years. She reports weight gain accelerated when she was staying at home with her grandmother who had dementia. She reports that she has made positive changes in her diet in past 3-4 months but has not experienced any weight loss; reports a stable weight during that time. During that time she has decreased frequency of eating "fast food", is preparing more meals at home and is including more vegetables. States that she likes most vegetables and fruit. She denies binging as a way to cope with her anxiety.  Her present diet is low in fruits, vegetables, whole grains and sources of calcium. Her main beverage is watermelon juice which is contributing at least 300 calories daily.  Physical activity:  Walks for exercise -reports 2-3 miles per day  Dietary Intake:  Usual eating pattern includes 2-3 meals and 2 snacks per day. Dining out frequency: 5 meals per week.  Breakfast: skips 3-4 days per week; or 2 eggs/ white toast, watermelon juice Snack: cereal bar Lunch: 2:00pm; sometimes skips; or has a [place] sandwich, mayonnaise, watermelon juice Supper: 6:00pmEx. fish or shrimp or scallops, pasta, green beans No food after dinner Beverages: 1-2 cups water, 4 cups watermelon juice Nutrition Care Education: Diabetes:  Instructed on a meal plan based on 1700 calories to promote weight loss, including carbohydrate counting, the need for a  structured meal plan and how to better balance carbohydrate, protein, fat and non-starchy vegetables. Discussed mindfulness and strategies verses relying on restriction and will power. Encouraged to focus on foods needed to meet nutrient needs. Also, discussed importance of exercise in blood sugar control and weight loss. Fatty Liver: Discussed how weight loss and meal plan recommendations for diabetes are also recommended for fatty liver diagnosis.  Nutritional Diagnosis:  [place]o Colon-3.3 Overweight/obesity As related to history of frequent high fat fast food choices, over eating in response to stress as caregiver and lack of physical activity.  As evidenced by diet and exercise history..  Intervention:  Balance meals with protein, 2-4 servings of carbohydrate and free vegetables. Establish a meal plan of 3 meals, spaced 4-6 hours apart,  and 1-2 snacks. Add more fruits and vegetables. Include fruit or vegetable or both at meals. Measure out some portions of starchy foods especially pasta and rice, etc. Call [facility] to set up appointment using coupon. Decrease intake of watermelon juice.  Education Materials given:  . Plate Planner . Food lists/ Planning A Balanced Meal . Sample meal pattern/ menus . Goals/ instructions Learner/ who was taught:  . Patient  Level of understanding: Verbalized understanding. Learning barriers: . None Willingness to learn/ readiness for change: . Acceptance, ready for change Monitoring and Evaluation: Patient did not schedule a follow-up appointment at this time. Encouraged her to call if she would like further help with her die/nutrition.

## 2016-09-19 ENCOUNTER — Encounter: Payer: Self-pay | Admitting: General Surgery

## 2016-09-19 ENCOUNTER — Ambulatory Visit (INDEPENDENT_AMBULATORY_CARE_PROVIDER_SITE_OTHER): Payer: BLUE CROSS/BLUE SHIELD | Admitting: General Surgery

## 2016-09-19 VITALS — BP 152/94 | HR 94 | Temp 97.4°F | Resp 20 | Ht 62.0 in | Wt 245.0 lb

## 2016-09-19 DIAGNOSIS — K644 Residual hemorrhoidal skin tags: Secondary | ICD-10-CM

## 2016-09-19 DIAGNOSIS — K648 Other hemorrhoids: Secondary | ICD-10-CM

## 2016-09-19 MED ORDER — HYDROCORTISONE ACE-PRAMOXINE 2.5-1 % RE CREA: 1.0000 "application " | TOPICAL_CREAM | Freq: Three times a day (TID) | RECTAL | 0 refills | Status: DC

## 2016-09-19 NOTE — Progress Notes (Signed)
Patient ID: Monica Dominguez, female   DOB: [birth year], 40 y.o.   MRN: [id number]  Chief Complaint  Patient presents with  . Follow-up    hemorrhoid banding     HPI Monica Dominguez is a 40 y.o. female here today following up from a hemorrhoid banding done last week at Dr. Tamala Julian office. She state she was having pain darning the banding.Patient states she is having a lot of bleeding and pain. Bowels moving daily. Today there was blood clots with the BM. Sitz baths are helping some. She is not taking oxycodone for pain because she does not want to be constipated.  HPI  Past Medical History:  Diagnosis Date  . Anxiety   . CTS (carpal tunnel syndrome)   . Diabetes mellitus without complication ([place])   . Environmental allergies   . GERD (gastroesophageal reflux disease)   . Hyperlipemia   . Obesity   . Sleep apnea     Past Surgical History:  Procedure Laterality Date  . ABDOMINAL HYSTERECTOMY    . CARPAL TUNNEL RELEASE Right 04/03/2013  . ESOPHAGOGASTRODUODENOSCOPY (EGD) WITH PROPOFOL N/A 07/13/2016   Procedure: ESOPHAGOGASTRODUODENOSCOPY (EGD) WITH PROPOFOL;  Surgeon: Manya Silvas, MD;  Location: [facility] ENDOSCOPY;  Service: Endoscopy;  Laterality: N/A;  . TONSILLECTOMY      Family History  Problem Relation Age of Onset  . Breast cancer Mother 61    s/p mastectomy  . Breast cancer Maternal Grandmother 60    dx. 64-65  . Breast cancer Maternal Aunt     dx. 39-40  . Colon polyps Father     approx 2 polyps  . Lung cancer Maternal Grandfather     d. 70; smoker  . Cervical cancer Paternal Grandmother     d. 2 or older  . Lung cancer Other     maternal great uncle (MGM's brother); cigar smoker  . Lung cancer Other     maternal great grandmother (MGM's mother); not a smoker  . Breast cancer Cousin     Social History Social History  Substance Use Topics  . Smoking status: Current Some Day Smoker    Packs/day: 1.00    Years: 10.00    Types: Cigarettes    Last attempt to  quit: 12/31/2008  . Smokeless tobacco: Never Used     Comment: quit smoking "about 5-10 years ago" (06/12/2016)  . Alcohol use No    Allergies  Allergen Reactions  . Latex Hives    Current Outpatient Prescriptions  Medication Sig Dispense Refill  . ALPRAZolam (XANAX) 0.25 MG tablet TK 1 T PO D PRN    . BAYER CONTOUR NEXT TEST test strip TEST BLOOD SUGAR TID  6  . cetirizine (ZYRTEC) 10 MG tablet Take 10 mg by mouth daily. Reported on 07/13/2016    . divalproex (DEPAKOTE) 500 MG DR tablet Take 500 mg by mouth 2 (two) times daily.    . hydrOXYzine (ATARAX/VISTARIL) 50 MG tablet TK 1 T PO BID PRN    . lovastatin (MEVACOR) 20 MG tablet Take 20 mg by mouth every evening.    . pantoprazole (PROTONIX) 40 MG tablet Take 40 mg by mouth 2 (two) times daily before a meal.    . hydrocortisone-pramoxine (ANALPRAM HC) 2.5-1 % rectal cream Place 1 application rectally 3 (three) times daily. 30 g 0   No current facility-administered medications for this visit.     Review of Systems Review of Systems  Constitutional: Negative.   Respiratory: Negative.   Cardiovascular: Negative.  Gastrointestinal: Positive for anal bleeding and rectal pain.  Neurological: Positive for headaches.    Blood pressure (!) 152/94, pulse 94, temperature 97.4 F (36.3 C), temperature source Oral, resp. rate 20, height 5\' 2"  (1.575 m), weight 245 lb (111.1 kg).  Physical Exam Physical Exam  Constitutional: She is oriented to person, place, and time. She appears well-developed and well-nourished.  Eyes: Conjunctivae are normal.  Neck: Neck supple.  Cardiovascular: Normal rate, regular rhythm and normal heart sounds.   Pulmonary/Chest: Effort normal and breath sounds normal.  Genitourinary: Rectal exam shows external hemorrhoid.     Neurological: She is alert and oriented to person, place, and time.  Skin: Skin is warm and dry.    Data Reviewed Office note from 09/10/2016.  Rectal polyp consistent with  hyperplastic polyp.  Assessment    Pain secondary to involvement of the sensate skin of the anus during recent hemorrhoid banding.    Plan    Local measures with ice and anti-inflammatories with appropriate.    Sent Analpram use  three time daily.Follow up with Dr. Tamala Julian This information has been scribed by Karie Fetch RN, BSN,BC.   Robert Bellow 09/20/2016, 7:13 AM

## 2016-09-19 NOTE — Patient Instructions (Signed)
Sent Analpram use  three time daily Patient may take tylenol, advil, or aleve as needed for pain or discomfort.

## 2016-09-20 DIAGNOSIS — K644 Residual hemorrhoidal skin tags: Secondary | ICD-10-CM | POA: Insufficient documentation

## 2016-09-24 ENCOUNTER — Other Ambulatory Visit: Payer: Self-pay | Admitting: Adult Health

## 2016-09-24 ENCOUNTER — Ambulatory Visit
Admission: RE | Admit: 2016-09-24 | Discharge: 2016-09-24 | Disposition: A | Discharge status: Home / Self Care | Payer: BLUE CROSS/BLUE SHIELD | Source: Ambulatory Visit | Attending: Adult Health | Admitting: Adult Health

## 2016-09-24 DIAGNOSIS — M7989 Other specified soft tissue disorders: Secondary | ICD-10-CM

## 2016-11-05 ENCOUNTER — Encounter
Admission: RE | Admit: 2016-11-05 | Discharge: 2016-11-05 | Disposition: A | Discharge status: Home / Self Care | Payer: BLUE CROSS/BLUE SHIELD | Source: Ambulatory Visit | Attending: Surgery | Admitting: Surgery

## 2016-11-05 HISTORY — DX: Bipolar disorder, unspecified: F31.9

## 2016-11-05 HISTORY — DX: Unspecified osteoarthritis, unspecified site: M19.90

## 2016-11-05 NOTE — Patient Instructions (Signed)
  Your procedure is scheduled on:11/09/16 Report to Day Surgery. [facility]LO[facility] To find out your arrival time please call [phone] between 1PM - 3PM on 11/08/16  Remember: Instructions that are not followed completely may result in serious medical risk, up to and including death, or upon the discretion of your surgeon and anesthesiologist your surgery may need to be rescheduled.    _X___ 1. Do not eat food or drink liquids after midnight. No gum chewing or hard candies.     _X___ 2. No Alcohol for 24 hours before or after surgery.   _X___ 3. Do Not Smoke For 24 Hours Prior to Your Surgery.   ____ 4. Bring all medications with you on the day of surgery if instructed.    __X__ 5. Notify your doctor if there is any change in your medical condition     (cold, fever, infections).       Do not wear jewelry, make-up, hairpins, clips or nail polish.  Do not wear lotions, powders, or perfumes. You may wear deodorant.  Do not shave 48 hours prior to surgery. Men may shave face and neck.  Do not bring valuables to the hospital.    [facility] is not responsible for any belongings or valuables.               Contacts, dentures or bridgework may not be worn into surgery.  Leave your suitcase in the car. After surgery it may be brought to your room.  For patients admitted to the hospital, discharge time is determined by your                treatment team.   Patients discharged the day of surgery will not be allowed to drive home.      _X___ Take these medicines the morning of surgery with A SIP OF WATER:    1.OMEPRAZOLE  2. ALPRAZOLAM OR CLONAZEPAM IF NEEDED  3.   4.  5.  6.  ____ Fleet Enema (as directed)   ____ Use CHG Soap as directed  ____ Use inhalers on the day of surgery  ____ Stop metformin 2 days prior to surgery    ____ Take 1/2 of usual insulin dose the night before surgery and none on the morning of surgery.   ____ Stop Coumadin/Plavix/aspirin on   ____ Stop Anti-inflammatories on   ____ Stop supplements until after surgery.    _X___ Bring C-Pap to the hospital.

## 2016-11-09 ENCOUNTER — Encounter: Payer: Self-pay | Admitting: *Deleted

## 2016-11-09 ENCOUNTER — Ambulatory Visit: Payer: BLUE CROSS/BLUE SHIELD | Admitting: Registered Nurse

## 2016-11-09 ENCOUNTER — Ambulatory Visit
Admission: RE | Admit: 2016-11-09 | Discharge: 2016-11-09 | Disposition: A | Discharge status: Home / Self Care | Payer: BLUE CROSS/BLUE SHIELD | Source: Ambulatory Visit | Attending: Surgery | Admitting: Surgery

## 2016-11-09 ENCOUNTER — Encounter
Admission: RE | Disposition: A | Discharge status: Home / Self Care | Payer: Self-pay | Source: Ambulatory Visit | Attending: Surgery

## 2016-11-09 DIAGNOSIS — K644 Residual hemorrhoidal skin tags: Secondary | ICD-10-CM | POA: Diagnosis not present

## 2016-11-09 DIAGNOSIS — K219 Gastro-esophageal reflux disease without esophagitis: Secondary | ICD-10-CM | POA: Insufficient documentation

## 2016-11-09 DIAGNOSIS — K648 Other hemorrhoids: Secondary | ICD-10-CM | POA: Diagnosis present

## 2016-11-09 DIAGNOSIS — Z888 Allergy status to other drugs, medicaments and biological substances status: Secondary | ICD-10-CM | POA: Insufficient documentation

## 2016-11-09 DIAGNOSIS — F319 Bipolar disorder, unspecified: Secondary | ICD-10-CM | POA: Insufficient documentation

## 2016-11-09 DIAGNOSIS — F419 Anxiety disorder, unspecified: Secondary | ICD-10-CM | POA: Diagnosis not present

## 2016-11-09 DIAGNOSIS — G4730 Sleep apnea, unspecified: Secondary | ICD-10-CM | POA: Insufficient documentation

## 2016-11-09 DIAGNOSIS — E119 Type 2 diabetes mellitus without complications: Secondary | ICD-10-CM | POA: Insufficient documentation

## 2016-11-09 DIAGNOSIS — Z91040 Latex allergy status: Secondary | ICD-10-CM | POA: Diagnosis not present

## 2016-11-09 DIAGNOSIS — Z7984 Long term (current) use of oral hypoglycemic drugs: Secondary | ICD-10-CM | POA: Insufficient documentation

## 2016-11-09 HISTORY — PX: HEMORRHOID SURGERY: SHX153

## 2016-11-09 LAB — POCT I-STAT 4, (NA,K, GLUC, HGB,HCT)
Glucose, Bld: 222 mg/dL — ABNORMAL HIGH (ref 65–99)
HCT: 45 % (ref 36.0–46.0)
Hemoglobin: 15.3 g/dL — ABNORMAL HIGH (ref 12.0–15.0)
POTASSIUM: 3.8 mmol/L (ref 3.5–5.1)
SODIUM: 142 mmol/L (ref 135–145)

## 2016-11-09 LAB — GLUCOSE, CAPILLARY
Glucose-Capillary: 219 mg/dL — ABNORMAL HIGH (ref 65–99)
Glucose-Capillary: 154 mg/dL — ABNORMAL HIGH (ref 65–99)

## 2016-11-09 SURGERY — HEMORRHOIDECTOMY
Anesthesia: General | Site: Rectum | Wound class: Clean Contaminated

## 2016-11-09 MED ORDER — LACTATED RINGERS IV SOLN
INTRAVENOUS | Status: DC | PRN
  Administered 2016-11-09 (×2): via INTRAVENOUS

## 2016-11-09 MED ORDER — PROPOFOL 10 MG/ML IV BOLUS
INTRAVENOUS | Status: DC | PRN
  Administered 2016-11-09: 160 mg via INTRAVENOUS

## 2016-11-09 MED ORDER — ONDANSETRON HCL 4 MG/2ML IJ SOLN: 4.0000 mg | Freq: Once | INTRAMUSCULAR | Status: DC | PRN

## 2016-11-09 MED ORDER — GLYCOPYRROLATE 0.2 MG/ML IJ SOLN
INTRAMUSCULAR | Status: DC | PRN
  Administered 2016-11-09: 0.2 mg via INTRAVENOUS

## 2016-11-09 MED ORDER — ONDANSETRON HCL 4 MG/2ML IJ SOLN
INTRAMUSCULAR | Status: DC | PRN
Start: 2016-11-09 — End: 2016-11-09
  Administered 2016-11-09: 4 mg via INTRAVENOUS

## 2016-11-09 MED ORDER — FENTANYL CITRATE (PF) 100 MCG/2ML IJ SOLN
INTRAMUSCULAR | Status: DC | PRN
  Administered 2016-11-09 (×5): 50 ug via INTRAVENOUS

## 2016-11-09 MED ORDER — ONDANSETRON HCL 4 MG PO TABS
ORAL_TABLET | ORAL | Status: AC
  Filled 2016-11-09: qty 1

## 2016-11-09 MED ORDER — OXYCODONE-ACETAMINOPHEN 5-325 MG PO TABS: 1.0000 | ORAL_TABLET | ORAL | 0 refills | Status: DC | PRN

## 2016-11-09 MED ORDER — BUPIVACAINE-EPINEPHRINE (PF) 0.5% -1:200000 IJ SOLN
INTRAMUSCULAR | Status: AC
  Filled 2016-11-09: qty 30

## 2016-11-09 MED ORDER — PROMETHAZINE HCL 25 MG PO TABS
25.0000 mg | ORAL_TABLET | ORAL | Status: AC
  Administered 2016-11-09: 25 mg via ORAL

## 2016-11-09 MED ORDER — OXYCODONE-ACETAMINOPHEN 5-325 MG PO TABS
ORAL_TABLET | ORAL | Status: AC
  Filled 2016-11-09: qty 1

## 2016-11-09 MED ORDER — FENTANYL CITRATE (PF) 100 MCG/2ML IJ SOLN
INTRAMUSCULAR | Status: AC
  Administered 2016-11-09: 25 ug via INTRAVENOUS
  Filled 2016-11-09: qty 2

## 2016-11-09 MED ORDER — MIDAZOLAM HCL 2 MG/2ML IJ SOLN
INTRAMUSCULAR | Status: DC | PRN
  Administered 2016-11-09: 2 mg via INTRAVENOUS

## 2016-11-09 MED ORDER — OXYCODONE-ACETAMINOPHEN 5-325 MG PO TABS
1.0000 | ORAL_TABLET | ORAL | Status: DC | PRN
  Administered 2016-11-09: 1 via ORAL

## 2016-11-09 MED ORDER — BUPIVACAINE-EPINEPHRINE (PF) 0.5% -1:200000 IJ SOLN
INTRAMUSCULAR | Status: DC | PRN
  Administered 2016-11-09: 30 mL via PERINEURAL

## 2016-11-09 MED ORDER — FENTANYL CITRATE (PF) 100 MCG/2ML IJ SOLN
25.0000 ug | INTRAMUSCULAR | Status: AC | PRN
  Administered 2016-11-09 (×6): 25 ug via INTRAVENOUS

## 2016-11-09 MED ORDER — ONDANSETRON HCL 4 MG PO TABS: 4.0000 mg | ORAL_TABLET | Freq: Once | ORAL | Status: DC

## 2016-11-09 MED ORDER — LIDOCAINE HCL (CARDIAC) 20 MG/ML IV SOLN
INTRAVENOUS | Status: DC | PRN
  Administered 2016-11-09: 100 mg via INTRAVENOUS

## 2016-11-09 MED ORDER — PROMETHAZINE HCL 25 MG PO TABS
ORAL_TABLET | ORAL | Status: AC
  Administered 2016-11-09: 25 mg via ORAL
  Filled 2016-11-09: qty 1

## 2016-11-09 SURGICAL SUPPLY — 33 items
BLADE SURG 15 STRL LF DISP TIS (BLADE) ×1 IMPLANT
BLADE SURG 15 STRL SS (BLADE) ×2
CANISTER SUCT 1200ML W/VALVE (MISCELLANEOUS) ×3 IMPLANT
DRAPE LAPAROTOMY 100X77 ABD (DRAPES) ×3 IMPLANT
DRAPE LEGGINS SURG 28X43 STRL (DRAPES) ×3 IMPLANT
DRAPE UNDER BUTTOCK W/FLU (DRAPES) ×3 IMPLANT
ELECT REM PT RETURN 9FT ADLT (ELECTROSURGICAL) ×3
ELECTRODE REM PT RTRN 9FT ADLT (ELECTROSURGICAL) ×1 IMPLANT
GAUZE SPONGE 4X4 12PLY STRL (GAUZE/BANDAGES/DRESSINGS) ×3 IMPLANT
GLOVE BIO SURGEON STRL SZ7.5 (GLOVE) ×3 IMPLANT
GOWN STRL REUS W/ TWL LRG LVL3 (GOWN DISPOSABLE) ×2 IMPLANT
GOWN STRL REUS W/TWL LRG LVL3 (GOWN DISPOSABLE) ×4
HARMONIC SCALPEL FOCUS (MISCELLANEOUS) ×3 IMPLANT
LABEL OR SOLS (LABEL) ×3 IMPLANT
NEEDLE HYPO 25X1 1.5 SAFETY (NEEDLE) ×3 IMPLANT
NS IRRIG 500ML POUR BTL (IV SOLUTION) ×3 IMPLANT
PACK BASIN MINOR ARMC (MISCELLANEOUS) ×3 IMPLANT
PAD ABD DERMACEA PRESS 5X9 (GAUZE/BANDAGES/DRESSINGS) ×3 IMPLANT
PAD PREP 24X41 OB/GYN DISP (PERSONAL CARE ITEMS) ×3 IMPLANT
PENCIL ELECTRO HAND CTR (MISCELLANEOUS) ×3 IMPLANT
SOL PREP PVP 2OZ (MISCELLANEOUS) ×3
SOLUTION PREP PVP 2OZ (MISCELLANEOUS) ×1 IMPLANT
STAPLER PROXIMATE HCS (STAPLE) ×3 IMPLANT
STRAP SAFETY BODY (MISCELLANEOUS) ×3 IMPLANT
SURGILUBE 2OZ TUBE FLIPTOP (MISCELLANEOUS) ×3 IMPLANT
SUT CHROMIC 2 0 SH (SUTURE) ×9 IMPLANT
SUT CHROMIC 3 0 SH 27 (SUTURE) ×9 IMPLANT
SUT CHROMIC 4 0 SH 27 (SUTURE) ×3 IMPLANT
SUT ETHILON 3-0 FS-10 30 BLK (SUTURE) ×3
SUT PROLENE 3 0 CT 1 (SUTURE) ×3 IMPLANT
SUT PROLENE 3 0 PS 2 (SUTURE) ×3 IMPLANT
SUTURE EHLN 3-0 FS-10 30 BLK (SUTURE) ×1 IMPLANT
SYRINGE 10CC LL (SYRINGE) ×3 IMPLANT

## 2016-11-09 NOTE — Op Note (Signed)
OPERATIVE REPORT  PREOPERATIVE  DIAGNOSIS: . Internal and external hemorrhoids  POSTOPERATIVE DIAGNOSIS: . Internal and external hemorrhoids  PROCEDURE: . Stapled internal hemorrhoidectomy and excision of external hemorrhoidal skin tag's  ANESTHESIA:  General  SURGEON: Rochel Brome  MD   INDICATIONS: . She has history of rectal bleeding and anal pain. She had findings of large internal hemorrhoids. She also had 2 mildly symptomatic external hemorrhoidal tags. Surgery was recommended for definitive treatment.  The patient was placed on the operating table in the supine position under general anesthesia. The legs were elevated into the lithotomy position using ankle straps. The anal area was prepared with Betadine solution and draped with sterile towels and sheets. Gelpi retractors were used to expose the anus. The anoderm was infiltrated with half percent Sensorcaine with epinephrine and also deeper tissues surrounding the sphincter using a total of 30 cc. The anal canal was dilated large enough to admit 4 fingers. The bivalve anoscope was introduced and further gently dilated the anal canal and noted multiple large internal hemorrhoids and some bright red blood. The transparent anal retractor was introduced and was sutured to the surrounding anoderm with 3-0 nylon sutures with 4 point fixation. The side-viewing endoscope was introduced. A 30 pursestring suture was placed at the upper extent of the internal hemorrhoids. The EEA PPH stapling instrument was introduced and the pursestring was tied down within the stapling instrument. The instrument was closed to the firing range and held for 30 seconds activated and held for 30 seconds and removed. The ring of hemorrhoidal tissue was submitted in formalin for routine pathology. The staple line was inspected and there was some momentary bleeding which resolved spontaneously.  The transparent anal retractor was removed. The external hemorrhoidal skin tags  one of which was posterior and one was anterior were each sharply excised. Several small bleeding points are cauterized. The anterior wound was repaired with interrupted 4-0 chromic sutures. The posterior wound was very small. These were also submitted for routine pathology.  The bivalve anal retractor was reintroduced to examine the staple line and that hemostasis appeared to be intact. The retractor was removed. Dressings were applied with paper tape.  The patient appeared to tolerate the procedure satisfactorily and was then prepared for transfer to the recovery room  [facility].D.

## 2016-11-09 NOTE — Anesthesia Postprocedure Evaluation (Signed)
Anesthesia Post Note  Patient: Monica Dominguez  Procedure(s) Performed: Procedure(s) (LRB): HEMORRHOIDECTOMY (N/A)  Patient location during evaluation: PACU Anesthesia Type: General Level of consciousness: awake and alert Pain management: pain level controlled Vital Signs Assessment: post-procedure vital signs reviewed and stable Respiratory status: spontaneous breathing and respiratory function stable Cardiovascular status: stable Anesthetic complications: no    Last Vitals:  Vitals:   11/09/16 1102 11/09/16 1105  BP:  (!) 141/73  Pulse: (!) 103 (!) 102  Resp: 14 20  Temp:      Last Pain:  Vitals:   11/09/16 1105  TempSrc:   PainSc: 6                  [name]

## 2016-11-09 NOTE — H&P (Signed)
  She reports no change in condition since the recent office exam. She has history of rectal bleeding associated with large internal hemorrhoids. He also has had 2 symptomatic external skin tags.  Lab work noted  I discussed the plan for surgery internal stapled hemorrhoidectomy and excision of 2 external skin tags.

## 2016-11-09 NOTE — OR Nursing (Signed)
Pt up to bathroom. Complained of nausea. Dr Tamala Julian called. Told him pt asked for phenergan. He ordered Zofran 4mg . Took 4mg  tablet to pt room and pt refused saying it would not do her any good and for me to return it. Dr Tamala Julian to come by and see pt when he finishes current surgery.

## 2016-11-09 NOTE — Anesthesia Procedure Notes (Signed)
Procedure Name: LMA Insertion Date/Time: 11/09/2016 9:22 AM Performed by: Doreen Salvage Pre-anesthesia Checklist: Patient identified, Patient being monitored, Timeout performed, Emergency Drugs available and Suction available Patient Re-evaluated:Patient Re-evaluated prior to inductionOxygen Delivery Method: Circle system utilized Preoxygenation: Pre-oxygenation with 100% oxygen Intubation Type: IV induction Ventilation: Mask ventilation without difficulty LMA: LMA inserted LMA Size: 4.5 Tube type: Oral Number of attempts: 1 Placement Confirmation: positive ETCO2 and breath sounds checked- equal and bilateral Tube secured with: Tape Dental Injury: Teeth and Oropharynx as per pre-operative assessment

## 2016-11-09 NOTE — Anesthesia Preprocedure Evaluation (Signed)
Anesthesia Evaluation  Patient identified by MRN, date of birth, ID band Patient awake    Reviewed: Allergy & Precautions, NPO status , Patient's Chart, lab work & pertinent test results  History of Anesthesia Complications Negative for: history of anesthetic complications  Airway Mallampati: II       Dental   Pulmonary sleep apnea and Continuous Positive Airway Pressure Ventilation , Current Smoker,           Cardiovascular negative cardio ROS       Neuro/Psych Anxiety Bipolar Disorder    GI/Hepatic Neg liver ROS, GERD  Medicated and Controlled,  Endo/Other  diabetes, Type 2, Oral Hypoglycemic Agents  Renal/GU negative Renal ROS     Musculoskeletal   Abdominal   Peds  Hematology negative hematology ROS (+)   Anesthesia Other Findings   Reproductive/Obstetrics                             Anesthesia Physical Anesthesia Plan  ASA: II  Anesthesia Plan: General   Post-op Pain Management:    Induction: Intravenous  Airway Management Planned: LMA  Additional Equipment:   Intra-op Plan:   Post-operative Plan:   Informed Consent: I have reviewed the patients History and Physical, chart, labs and discussed the procedure including the risks, benefits and alternatives for the proposed anesthesia with the patient or authorized representative who has indicated his/her understanding and acceptance.     Plan Discussed with:   Anesthesia Plan Comments:         Anesthesia Quick Evaluation

## 2016-11-09 NOTE — Transfer of Care (Signed)
Immediate Anesthesia Transfer of Care Note  Patient: Caitrin Pufahl  Procedure(s) Performed: Procedure(s): HEMORRHOIDECTOMY (N/A)  Patient Location: PACU  Anesthesia Type:General  Level of Consciousness: sedated  Airway & Oxygen Therapy: Patient Spontanous Breathing and Patient connected to face mask oxygen  Post-op Assessment: Report given to RN and Post -op Vital signs reviewed and stable  Post vital signs: Reviewed and stable  Last Vitals:  Vitals:   11/09/16 0756 11/09/16 1049  BP: (!) 163/86 133/85  Pulse: 81 (!) 103  Resp: 18 (!) 35  Temp: 36.8 C 0000000 C    Complications: No apparent anesthesia complications

## 2016-11-09 NOTE — Discharge Instructions (Signed)
Take Tylenol or Percocet if needed for pain.  Should not drive or do anything dangerous when taking Percocet.  Take stool softener 2 times per day.  Remove dressing later today or tomorrow.  May shower and/or sit in warm water as desired.  Tuck gauze or pad in underwear as needed for drainage. AMBULATORY SURGERY  DISCHARGE INSTRUCTIONS   1) The drugs that you were given will stay in your system until tomorrow so for the next 24 hours you should not:  A) Drive an automobile B) Make any legal decisions C) Drink any alcoholic beverage   2) You may resume regular meals tomorrow.  Today it is better to start with liquids and gradually work up to solid foods.  You may eat anything you prefer, but it is better to start with liquids, then soup and crackers, and gradually work up to solid foods.   3) Please notify your doctor immediately if you have any unusual bleeding, trouble breathing, redness and pain at the surgery site, drainage, fever, or pain not relieved by medication.    4) Additional Instructions:        Please contact your physician with any problems or Same Day Surgery at [phone], Monday through Friday 6 am to 4 pm, or [place] at [facility] number at [phone].

## 2016-11-12 LAB — SURGICAL PATHOLOGY

## 2016-11-28 DIAGNOSIS — F410 Panic disorder [episodic paroxysmal anxiety] without agoraphobia: Secondary | ICD-10-CM | POA: Insufficient documentation

## 2016-11-28 DIAGNOSIS — F3181 Bipolar II disorder: Secondary | ICD-10-CM | POA: Insufficient documentation

## 2016-11-28 DIAGNOSIS — F411 Generalized anxiety disorder: Secondary | ICD-10-CM | POA: Insufficient documentation

## 2017-02-24 IMAGING — CR DG WRIST COMPLETE 3+V*R*
4 series · 4 of 4 positions shown · non-contrast
Comparison: None.

CLINICAL DATA: Chronic right wrist pain. No known injury. Initial
encounter.

EXAM:
RIGHT WRIST - COMPLETE 3+ VIEW

[wrist pa]
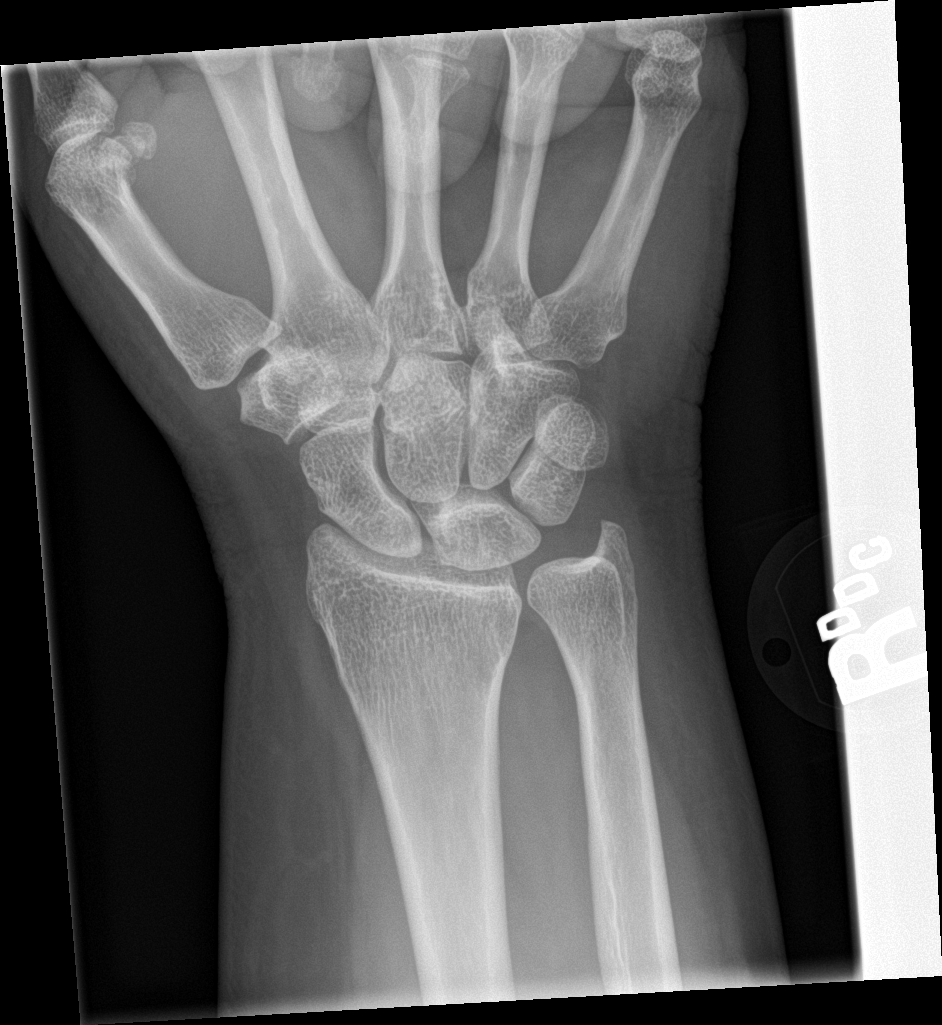

[wrist obl]
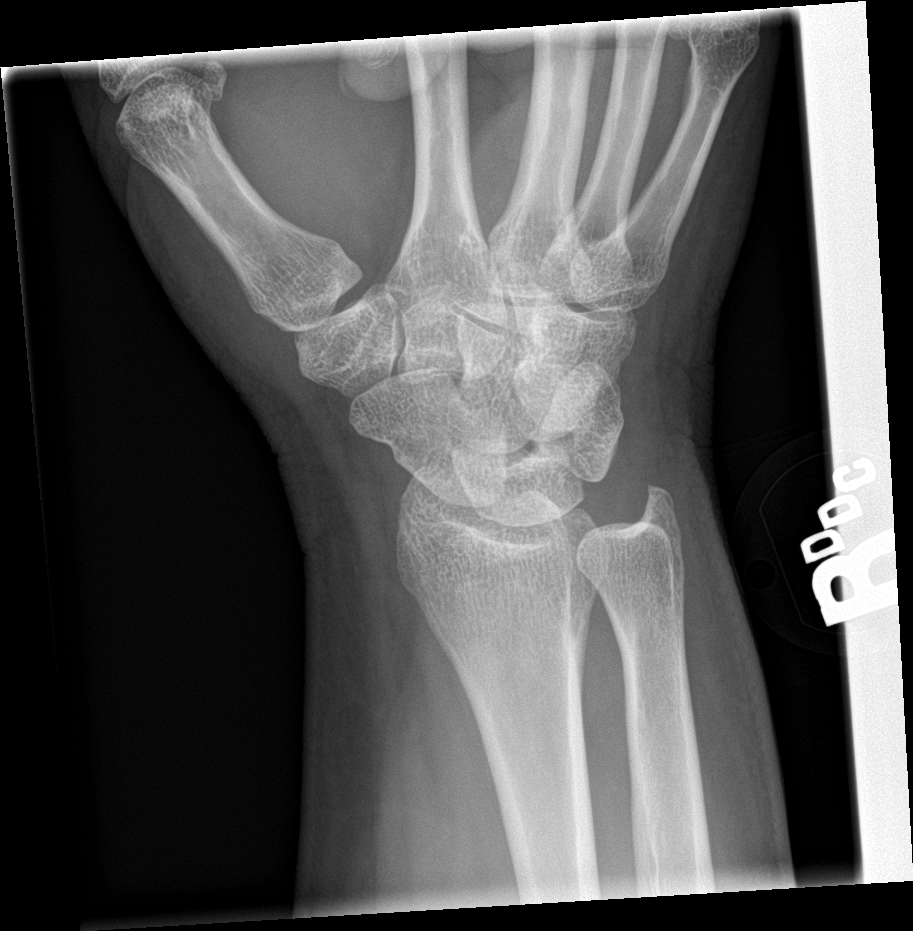

[wrist lat]
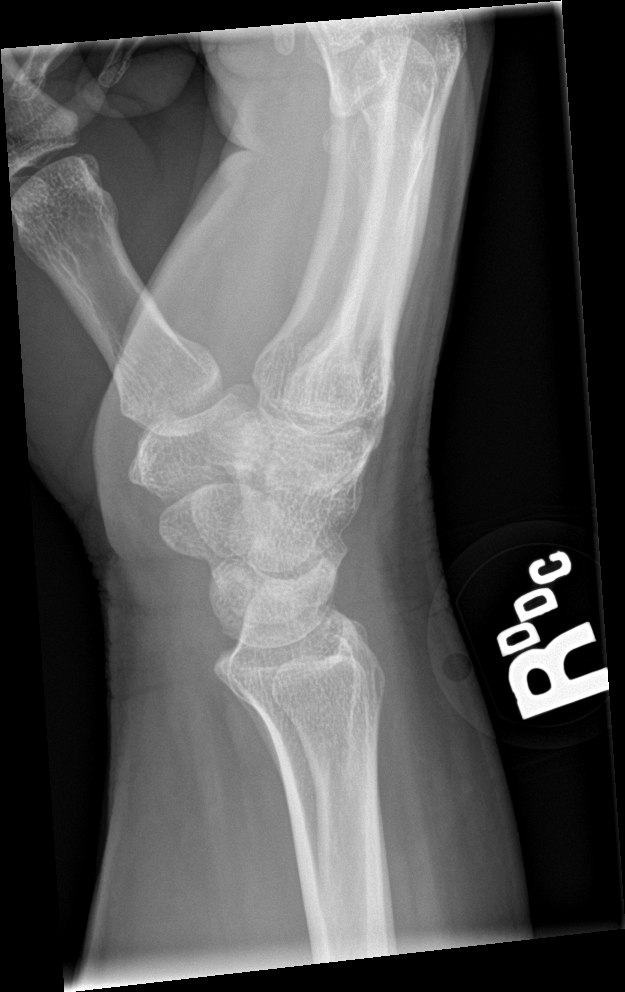

[navicular]
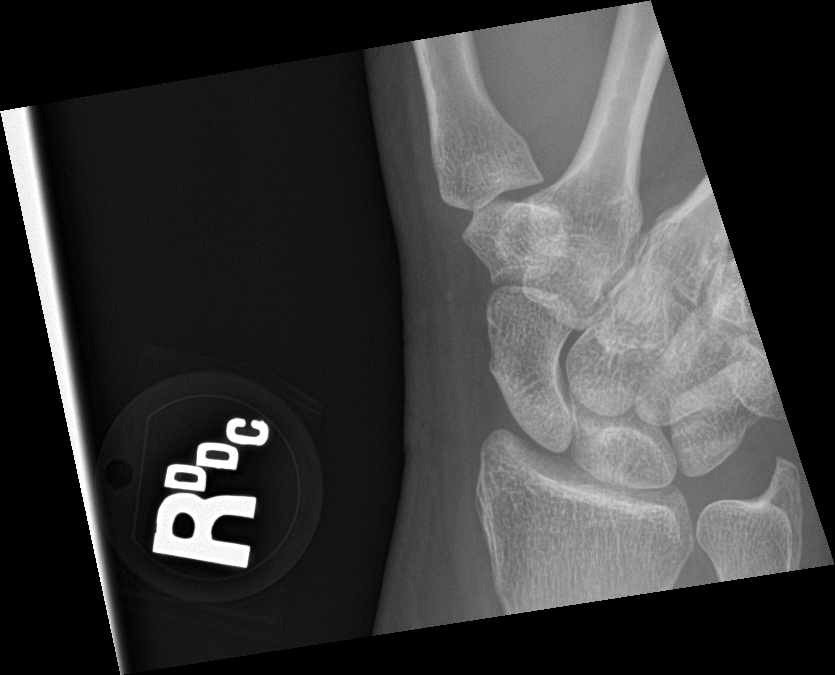

[4 of 4 positions shown; findings below may reference images not displayed]

FINDINGS: There is no evidence of fracture or dislocation. There is no
evidence of arthropathy or other focal bone abnormality. Soft
tissues are unremarkable.
IMPRESSION: Negative exam.

## 2017-04-03 DIAGNOSIS — E782 Mixed hyperlipidemia: Secondary | ICD-10-CM | POA: Insufficient documentation

## 2017-07-29 ENCOUNTER — Other Ambulatory Visit: Payer: Self-pay | Admitting: Student

## 2017-07-29 DIAGNOSIS — Z0000 Encounter for general adult medical examination without abnormal findings: Secondary | ICD-10-CM

## 2017-07-29 DIAGNOSIS — Z1231 Encounter for screening mammogram for malignant neoplasm of breast: Secondary | ICD-10-CM

## 2017-08-23 ENCOUNTER — Ambulatory Visit: Payer: BLUE CROSS/BLUE SHIELD | Admitting: Dietician

## 2017-08-26 ENCOUNTER — Ambulatory Visit: Payer: BLUE CROSS/BLUE SHIELD | Admitting: Dietician

## 2017-09-11 ENCOUNTER — Encounter: Payer: Self-pay | Admitting: Dietician

## 2017-09-11 NOTE — Progress Notes (Signed)
Patient did not keep her initial appointment or the rescheduled appointment. Sent discharge letter to referral provider.

## 2017-11-15 ENCOUNTER — Encounter: Payer: Self-pay | Admitting: *Deleted

## 2017-11-18 ENCOUNTER — Ambulatory Visit: Payer: BLUE CROSS/BLUE SHIELD | Admitting: Certified Registered Nurse Anesthetist

## 2017-11-18 ENCOUNTER — Encounter: Payer: Self-pay | Admitting: *Deleted

## 2017-11-18 ENCOUNTER — Ambulatory Visit
Admission: RE | Admit: 2017-11-18 | Discharge: 2017-11-18 | Disposition: A | Discharge status: Home / Self Care | Payer: BLUE CROSS/BLUE SHIELD | Source: Ambulatory Visit | Attending: Unknown Physician Specialty | Admitting: Unknown Physician Specialty

## 2017-11-18 ENCOUNTER — Encounter
Admission: RE | Disposition: A | Discharge status: Home / Self Care | Payer: Self-pay | Source: Ambulatory Visit | Attending: Unknown Physician Specialty

## 2017-11-18 DIAGNOSIS — M1990 Unspecified osteoarthritis, unspecified site: Secondary | ICD-10-CM | POA: Insufficient documentation

## 2017-11-18 DIAGNOSIS — E119 Type 2 diabetes mellitus without complications: Secondary | ICD-10-CM | POA: Diagnosis not present

## 2017-11-18 DIAGNOSIS — Z86010 Personal history of colonic polyps: Secondary | ICD-10-CM | POA: Diagnosis not present

## 2017-11-18 DIAGNOSIS — Z7982 Long term (current) use of aspirin: Secondary | ICD-10-CM | POA: Insufficient documentation

## 2017-11-18 DIAGNOSIS — F17210 Nicotine dependence, cigarettes, uncomplicated: Secondary | ICD-10-CM | POA: Insufficient documentation

## 2017-11-18 DIAGNOSIS — D123 Benign neoplasm of transverse colon: Secondary | ICD-10-CM | POA: Diagnosis not present

## 2017-11-18 DIAGNOSIS — E785 Hyperlipidemia, unspecified: Secondary | ICD-10-CM | POA: Insufficient documentation

## 2017-11-18 DIAGNOSIS — E669 Obesity, unspecified: Secondary | ICD-10-CM | POA: Diagnosis not present

## 2017-11-18 DIAGNOSIS — Z1211 Encounter for screening for malignant neoplasm of colon: Secondary | ICD-10-CM | POA: Diagnosis not present

## 2017-11-18 DIAGNOSIS — F319 Bipolar disorder, unspecified: Secondary | ICD-10-CM | POA: Diagnosis not present

## 2017-11-18 DIAGNOSIS — Z8371 Family history of colonic polyps: Secondary | ICD-10-CM | POA: Insufficient documentation

## 2017-11-18 DIAGNOSIS — K635 Polyp of colon: Secondary | ICD-10-CM | POA: Insufficient documentation

## 2017-11-18 DIAGNOSIS — K640 First degree hemorrhoids: Secondary | ICD-10-CM | POA: Insufficient documentation

## 2017-11-18 DIAGNOSIS — K219 Gastro-esophageal reflux disease without esophagitis: Secondary | ICD-10-CM | POA: Insufficient documentation

## 2017-11-18 DIAGNOSIS — F419 Anxiety disorder, unspecified: Secondary | ICD-10-CM | POA: Insufficient documentation

## 2017-11-18 DIAGNOSIS — Z6841 Body Mass Index (BMI) 40.0 and over, adult: Secondary | ICD-10-CM | POA: Diagnosis not present

## 2017-11-18 DIAGNOSIS — Z8541 Personal history of malignant neoplasm of cervix uteri: Secondary | ICD-10-CM | POA: Diagnosis not present

## 2017-11-18 DIAGNOSIS — Z79899 Other long term (current) drug therapy: Secondary | ICD-10-CM | POA: Insufficient documentation

## 2017-11-18 DIAGNOSIS — G4730 Sleep apnea, unspecified: Secondary | ICD-10-CM | POA: Diagnosis not present

## 2017-11-18 DIAGNOSIS — Z7951 Long term (current) use of inhaled steroids: Secondary | ICD-10-CM | POA: Insufficient documentation

## 2017-11-18 HISTORY — DX: Bipolar II disorder: F31.81

## 2017-11-18 HISTORY — PX: COLONOSCOPY WITH PROPOFOL: SHX5780

## 2017-11-18 HISTORY — DX: Malignant neoplasm of cervix uteri, unspecified: C53.9

## 2017-11-18 SURGERY — COLONOSCOPY WITH PROPOFOL
Anesthesia: General

## 2017-11-18 MED ORDER — PROPOFOL 500 MG/50ML IV EMUL
INTRAVENOUS | Status: AC
  Filled 2017-11-18: qty 50

## 2017-11-18 MED ORDER — MIDAZOLAM HCL 2 MG/2ML IJ SOLN
INTRAMUSCULAR | Status: AC
  Filled 2017-11-18: qty 2

## 2017-11-18 MED ORDER — PROPOFOL 10 MG/ML IV BOLUS
INTRAVENOUS | Status: DC | PRN
  Administered 2017-11-18: 80 mg via INTRAVENOUS

## 2017-11-18 MED ORDER — SODIUM CHLORIDE 0.9 % IV SOLN
INTRAVENOUS | Status: DC
  Administered 2017-11-18: 1000 mL via INTRAVENOUS

## 2017-11-18 MED ORDER — LIDOCAINE HCL (PF) 2 % IJ SOLN
INTRAMUSCULAR | Status: AC
  Filled 2017-11-18: qty 10

## 2017-11-18 MED ORDER — PROPOFOL 500 MG/50ML IV EMUL
INTRAVENOUS | Status: DC | PRN
  Administered 2017-11-18: 140 ug/kg/min via INTRAVENOUS

## 2017-11-18 MED ORDER — MIDAZOLAM HCL 2 MG/2ML IJ SOLN
INTRAMUSCULAR | Status: DC | PRN
  Administered 2017-11-18: 2 mg via INTRAVENOUS

## 2017-11-18 MED ORDER — SODIUM CHLORIDE 0.9 % IV SOLN: INTRAVENOUS | Status: DC

## 2017-11-18 NOTE — Anesthesia Post-op Follow-up Note (Signed)
Anesthesia QCDR form completed.        

## 2017-11-18 NOTE — Anesthesia Procedure Notes (Signed)
Performed by: Maranatha Grossi, CRNA Pre-anesthesia Checklist: Patient identified, Emergency Drugs available, Suction available, Patient being monitored and Timeout performed Patient Re-evaluated:Patient Re-evaluated prior to induction Oxygen Delivery Method: Nasal cannula Induction Type: IV induction       

## 2017-11-18 NOTE — Anesthesia Postprocedure Evaluation (Signed)
Anesthesia Post Note  Patient: Monica Dominguez  Procedure(s) Performed: COLONOSCOPY WITH PROPOFOL (N/A )  Patient location during evaluation: PACU Anesthesia Type: General Level of consciousness: awake Pain management: pain level controlled Vital Signs Assessment: post-procedure vital signs reviewed and stable Cardiovascular status: stable Anesthetic complications: no     Last Vitals:  Vitals:   11/18/17 1149 11/18/17 1159  BP: 109/78 104/75  Pulse: 66 (!) 58  Resp: 16 20  Temp:    SpO2: 100% 99%    Last Pain:  Vitals:   11/18/17 1129  TempSrc: Tympanic                 VAN STAVEREN,Finch Costanzo

## 2017-11-18 NOTE — Transfer of Care (Signed)
Immediate Anesthesia Transfer of Care Note  Patient: Monica Dominguez  Procedure(s) Performed: COLONOSCOPY WITH PROPOFOL (N/A )  Patient Location: PACU  Anesthesia Type:General  Level of Consciousness: awake, alert  and oriented  Airway & Oxygen Therapy: Patient Spontanous Breathing and Patient connected to nasal cannula oxygen  Post-op Assessment: Report given to RN and Post -op Vital signs reviewed and stable  Post vital signs: Reviewed and stable  Last Vitals:  Vitals:   11/18/17 1037 11/18/17 1129  BP: (!) 130/95   Pulse: 82   Resp: 20   Temp: (!) 36.2 C (!) 36.1 C  SpO2: 100%     Last Pain:  Vitals:   11/18/17 1129  TempSrc: Tympanic         Complications: No apparent anesthesia complications

## 2017-11-18 NOTE — Op Note (Addendum)
[facility] Gastroenterology Patient Name: Monica Dominguez Procedure Date: 11/18/2017 10:53 AM MRN: [id number] Account #: [id number] Date of Birth: Mar 13, 1976 Admit Type: Outpatient Age: 41 Room: [facility] ENDO ROOM 3 Gender: Female Note Status: Finalized Procedure:            Colonoscopy Indications:          High risk colon cancer surveillance: Personal history                        of colonic polyps Providers:            Manya Silvas, MD Referring MD:         Cantril, MD (Referring MD) Medicines:            Propofol per Anesthesia Complications:        No immediate complications. Procedure:            Pre-Anesthesia Assessment:                       - After reviewing the risks and benefits, the patient                        was deemed in satisfactory condition to undergo the                        procedure.                       After obtaining informed consent, the colonoscope was                        passed under direct vision. Throughout the procedure,                        the patient's blood pressure, pulse, and oxygen                        saturations were monitored continuously. The                        Colonoscope was introduced through the anus and                        advanced to the the cecum, identified by appendiceal                        orifice and ileocecal valve. The colonoscopy was                        performed without difficulty. The patient tolerated the                        procedure well. The quality of the bowel preparation                        was excellent. Findings:      A diminutive polyp was found in the transverse colon. The polyp was       sessile. The polyp was removed with a jumbo cold forceps. Resection and       retrieval were complete.      A diminutive  polyp was found in the ascending colon. The polyp was       sessile. The polyp was removed with a jumbo cold forceps.  Resection and       retrieval were complete.      A diminutive polyp was found in the sigmoid colon. The polyp was       sessile. The polyp was removed with a jumbo cold forceps. Resection and       retrieval were complete.      Two sessile polyps were found in the rectum. The polyps were diminutive       in size. These polyps were removed with a jumbo cold forceps. Resection       and retrieval were complete.      Internal hemorrhoids were found during endoscopy. The hemorrhoids were       small and Grade I (internal hemorrhoids that do not prolapse).      The exam was otherwise without abnormality. Impression:           - One diminutive polyp in the transverse colon, removed                        with a jumbo cold forceps. Resected and retrieved.                       - One diminutive polyp in the ascending colon, removed                        with a jumbo cold forceps. Resected and retrieved.                       - One diminutive polyp in the sigmoid colon, removed                        with a jumbo cold forceps. Resected and retrieved.                       - Two diminutive polyps in the rectum, removed with a                        jumbo cold forceps. Resected and retrieved.                       - Internal hemorrhoids.                       - The examination was otherwise normal. Recommendation:       - Await pathology results. Manya Silvas, MD 11/18/2017 11:30:20 AM This report has been signed electronically. Number of Addenda: 0 Note Initiated On: 11/18/2017 10:53 AM Scope Withdrawal Time: 0 hours 16 minutes 13 seconds  Total Procedure Duration: 0 hours 26 minutes 9 seconds       [facility]

## 2017-11-18 NOTE — H&P (Signed)
Primary Care Physician:  Center, [facility] Primary Gastroenterologist:  Dr. Vira Agar  Pre-Procedure History & Physical: HPI:  Monica Dominguez is a 41 y.o. female is here for an colonoscopy.   Past Medical History:  Diagnosis Date  . Anxiety   . Arthritis   . Bipolar 2 disorder ([place])   . Bipolar disorder ([place])   . Cervical cancer ([place])   . CTS (carpal tunnel syndrome)   . CTS (carpal tunnel syndrome)   . Diabetes mellitus without complication ([place])   . Environmental allergies   . GERD (gastroesophageal reflux disease)   . Hyperlipemia   . Obesity   . Sleep apnea    cpap    Past Surgical History:  Procedure Laterality Date  . ABDOMINAL HYSTERECTOMY    . CARPAL TUNNEL RELEASE Right 04/03/2013   x 2  . ESOPHAGOGASTRODUODENOSCOPY (EGD) WITH PROPOFOL N/A 07/13/2016   Performed by Manya Silvas, MD at [place]  . HEMORRHOIDECTOMY N/A 11/09/2016   Performed by Leonie Green, MD at [facility] ORS  . TONSILLECTOMY      Prior to Admission medications   Medication Sig Start Date End Date Taking? Authorizing Provider  fluticasone (FLONASE) 50 MCG/ACT nasal spray Place daily into both nostrils.   Yes [provider]  ziprasidone (GEODON) 80 MG capsule Take 80 mg 2 (two) times daily with a meal by mouth.   Yes [provider]  ALPRAZolam (XANAX) 0.25 MG tablet TK 1 T PO D PRN 07/31/16   [provider]  BAYER CONTOUR NEXT TEST test strip TEST BLOOD SUGAR TID 08/12/16   [provider]  cetirizine (ZYRTEC) 10 MG tablet Take 10 mg by mouth daily. Reported on 07/13/2016    [provider]  clonazePAM (KLONOPIN) 0.5 MG tablet Take 0.5 mg by mouth daily.    [provider]  hydrOXYzine (ATARAX/VISTARIL) 50 MG tablet TK 1 T PO BID PRN 07/31/16   [provider]  lovastatin (MEVACOR) 20 MG tablet Take 20 mg by mouth every evening.    [provider]  oxyCODONE-acetaminophen (ROXICET) 5-325 MG tablet  Take 1-2 tablets by mouth every 4 (four) hours as needed for severe pain. 11/09/16   Leonie Green, MD  pantoprazole (PROTONIX) 40 MG tablet Take 40 mg by mouth 2 (two) times daily before a meal.    [provider]    Allergies as of 09/03/2017 - Review Complete 11/09/2016  Allergen Reaction Noted  . Latex Hives 05/21/2016  . Lamictal [lamotrigine] Rash 11/05/2016    Family History  Problem Relation Age of Onset  . Breast cancer Mother 1       s/p mastectomy  . Breast cancer Maternal Grandmother 60       dx. 64-65  . Breast cancer Maternal Aunt        dx. 39-40  . Colon polyps Father        approx 2 polyps  . Lung cancer Maternal Grandfather        d. 58; smoker  . Cervical cancer Paternal Grandmother        d. 8 or older  . Lung cancer Other        maternal great uncle (MGM's brother); cigar smoker  . Lung cancer Other        maternal great grandmother (MGM's mother); not a smoker  . Breast cancer Cousin     Social History   Socioeconomic History  . Marital status: Married    Spouse  name: Not on file  . Number of children: Not on file  . Years of education: Not on file  . Highest education level: Not on file  Social Needs  . Financial resource strain: Not on file  . Food insecurity - worry: Not on file  . Food insecurity - inability: Not on file  . Transportation needs - medical: Not on file  . Transportation needs - non-medical: Not on file  Occupational History  . Not on file  Tobacco Use  . Smoking status: Current Some Day Smoker    Packs/day: 1.00    Years: 10.00    Pack years: 10.00    Types: Cigarettes  . Smokeless tobacco: Never Used  . Tobacco comment: quit smoking "about 5-10 years ago" (06/12/2016)  Substance and Sexual Activity  . Alcohol use: No  . Drug use: No  . Sexual activity: Not on file  Other Topics Concern  . Not on file  Social History Narrative  . Not on file    Review of Systems: See HPI, otherwise negative  ROS  Physical Exam: BP (!) 130/95   Pulse 82   Temp (!) 97.1 F (36.2 C) (Tympanic)   Resp 20   Ht 5\' 2"  (1.575 m)   Wt 111.1 kg (245 lb)   SpO2 100%   BMI 44.81 kg/m  General:   Alert,  pleasant and cooperative in NAD Head:  Normocephalic and atraumatic. Neck:  Supple; no masses or thyromegaly. Lungs:  Clear throughout to auscultation.    Heart:  Regular rate and rhythm. Abdomen:  Soft, nontender and nondistended. Normal bowel sounds, without guarding, and without rebound.   Neurologic:  Alert and  oriented x4;  grossly normal neurologically.  Impression/Plan: Monica Dominguez is here for an colonoscopy to be performed for [facility] colon polyps.  Risks, benefits, limitations, and alternatives regarding  colonoscopy have been reviewed with the patient.  Questions have been answered.  All parties agreeable.   Gaylyn Cheers, MD  11/18/2017, 10:50 AM

## 2017-11-18 NOTE — Anesthesia Preprocedure Evaluation (Signed)
Anesthesia Evaluation  Patient identified by MRN, date of birth, ID band Patient awake    Reviewed: Allergy & Precautions, NPO status , Patient's Chart, lab work & pertinent test results  Airway Mallampati: II       Dental  (+) Teeth Intact   Pulmonary sleep apnea , Current Smoker,     + decreased breath sounds      Cardiovascular Exercise Tolerance: Good  Rhythm:Regular Rate:Normal     Neuro/Psych Anxiety Bipolar Disorder    GI/Hepatic Neg liver ROS, GERD  Medicated,  Endo/Other  diabetes, Type 2, Oral Hypoglycemic Agents  Renal/GU      Musculoskeletal   Abdominal (+) + obese,   Peds negative pediatric ROS (+)  Hematology   Anesthesia Other Findings   Reproductive/Obstetrics                             Anesthesia Physical Anesthesia Plan  ASA: III  Anesthesia Plan: General   Post-op Pain Management:    Induction:   PONV Risk Score and Plan: 0  Airway Management Planned: Natural Airway and Nasal Cannula  Additional Equipment:   Intra-op Plan:   Post-operative Plan:   Informed Consent: I have reviewed the patients History and Physical, chart, labs and discussed the procedure including the risks, benefits and alternatives for the proposed anesthesia with the patient or authorized representative who has indicated his/her understanding and acceptance.     Plan Discussed with: CRNA  Anesthesia Plan Comments:         Anesthesia Quick Evaluation

## 2017-11-19 ENCOUNTER — Encounter: Payer: Self-pay | Admitting: Unknown Physician Specialty

## 2017-11-19 LAB — SURGICAL PATHOLOGY

## 2018-03-05 IMAGING — US US ABDOMEN LIMITED
1 series · 14 of 25 positions shown · non-contrast
Comparison: None.

CLINICAL DATA: Right upper quadrant pain. Nausea and vomiting for 3
days.

EXAM:
US ABDOMEN LIMITED - RIGHT UPPER QUADRANT

[Series 1: us abdomen limited · 0.22mm/px · 14 of 47 slices shown]
[im 1/47]
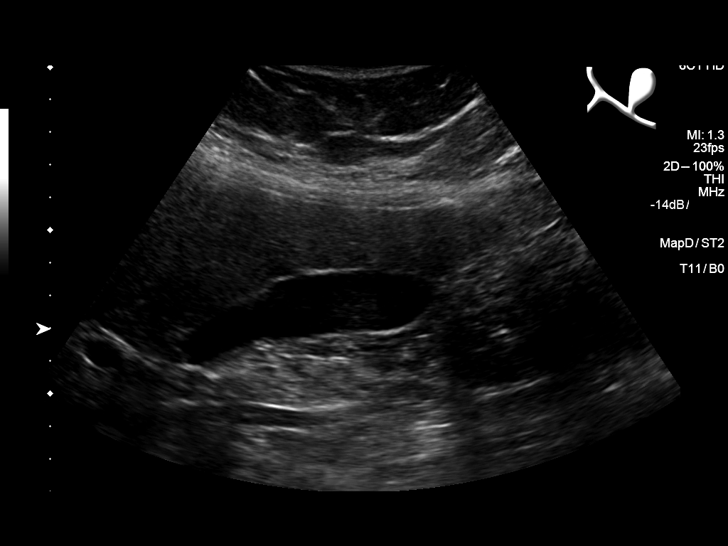
[im 4/47]
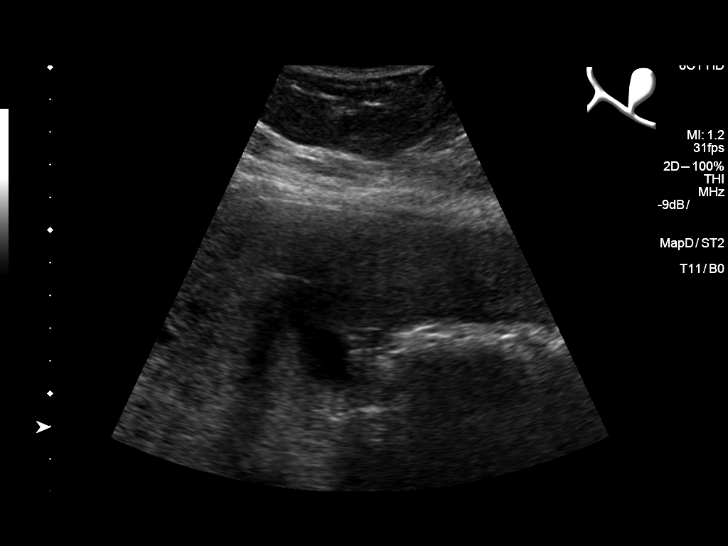
[im 8/47]
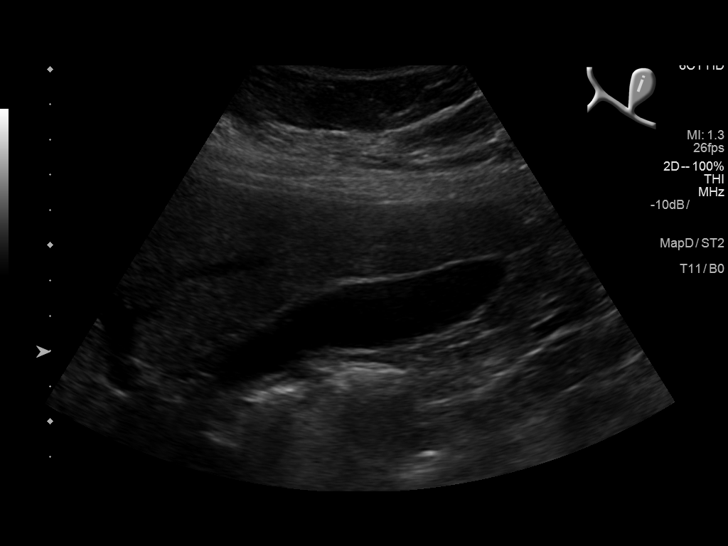
[im 12/47]
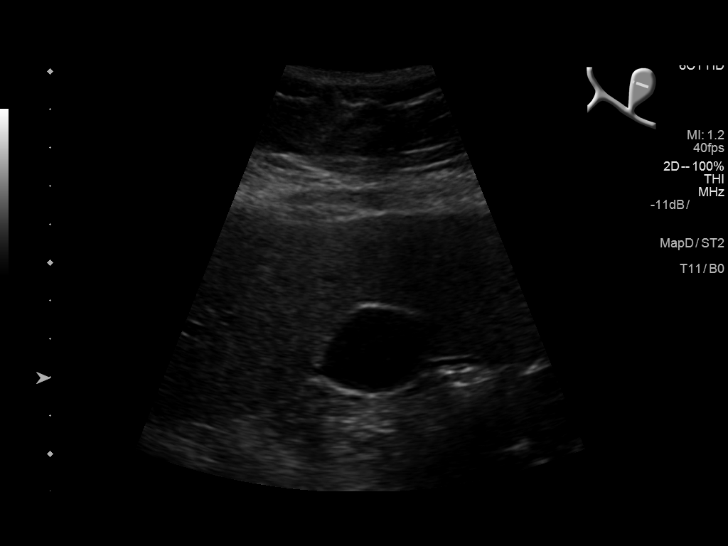
[im 16/47]
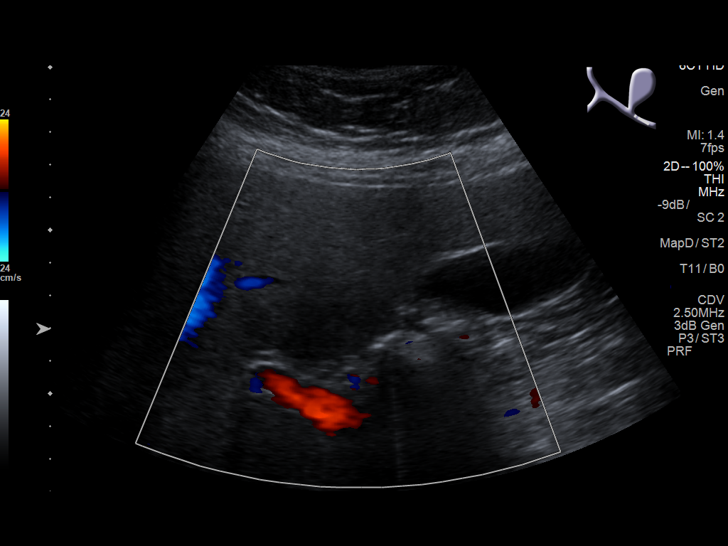
[im 18/47]
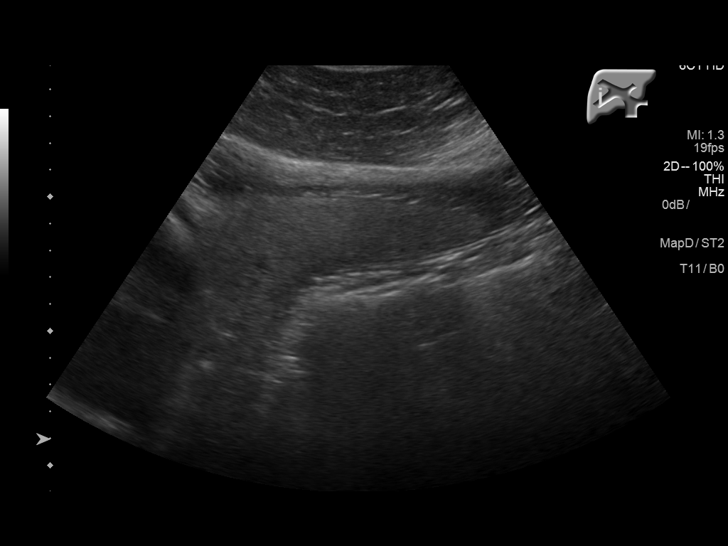
[im 22/47]
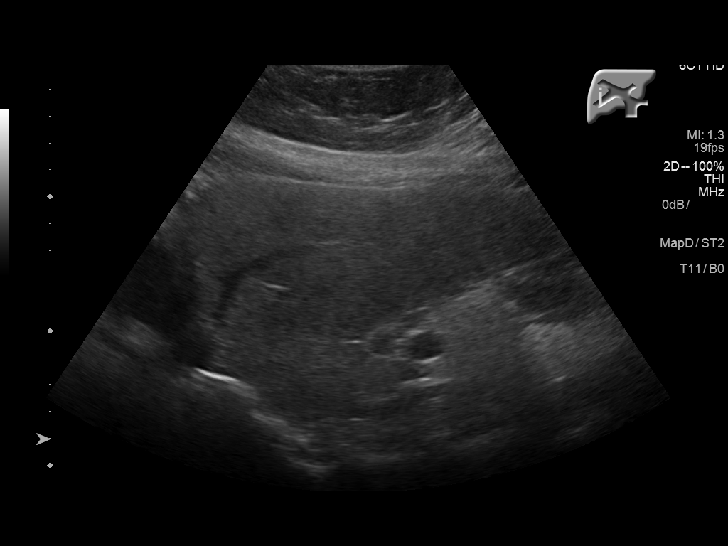
[im 25/47]
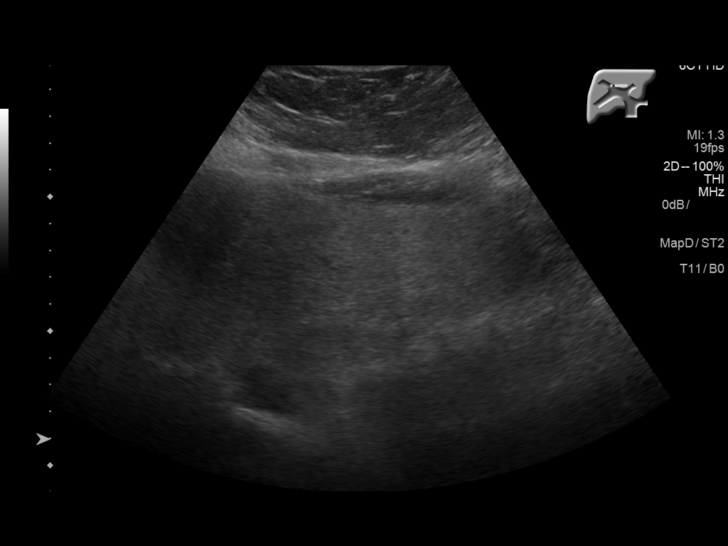
[im 29/47]
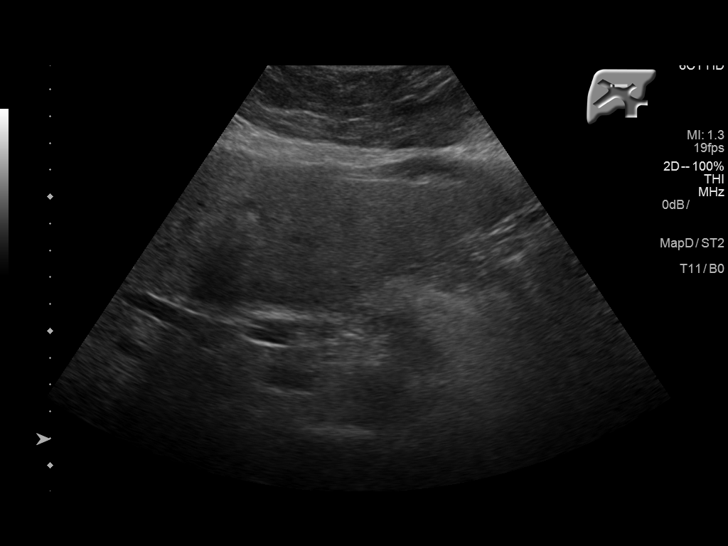
[im 31/47]
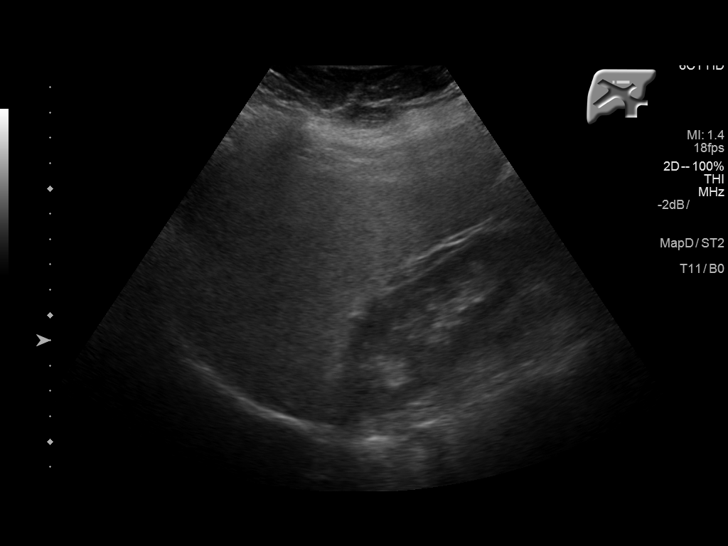
[im 35/47]
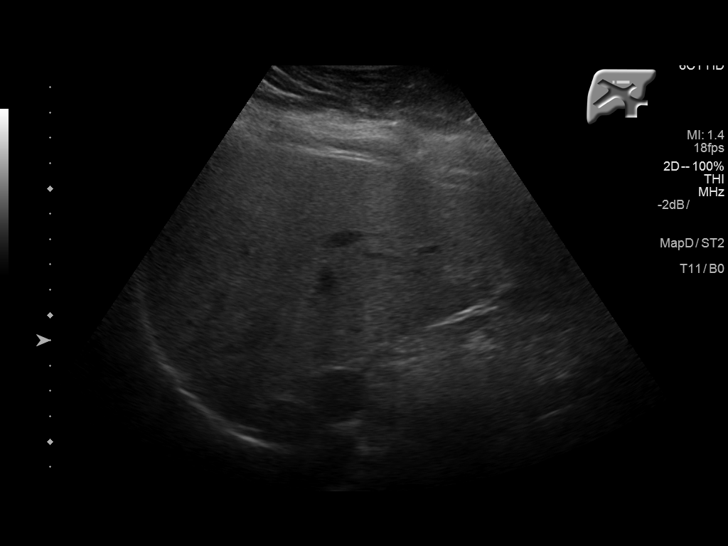
[im 39/47]
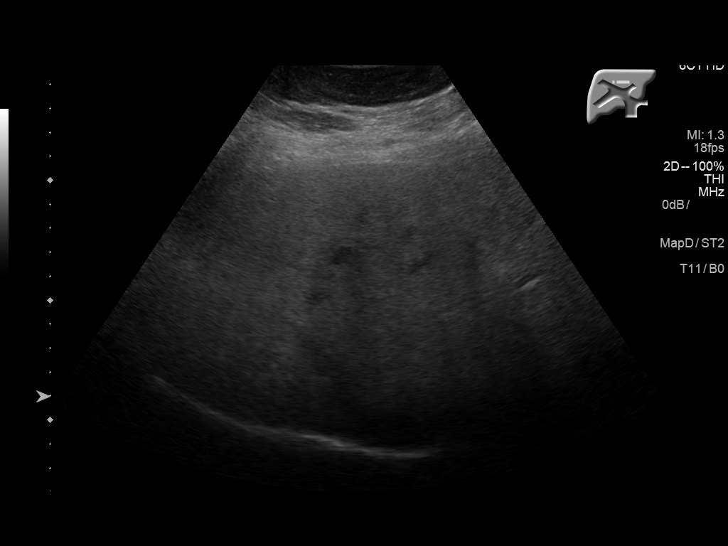
[im 43/47]
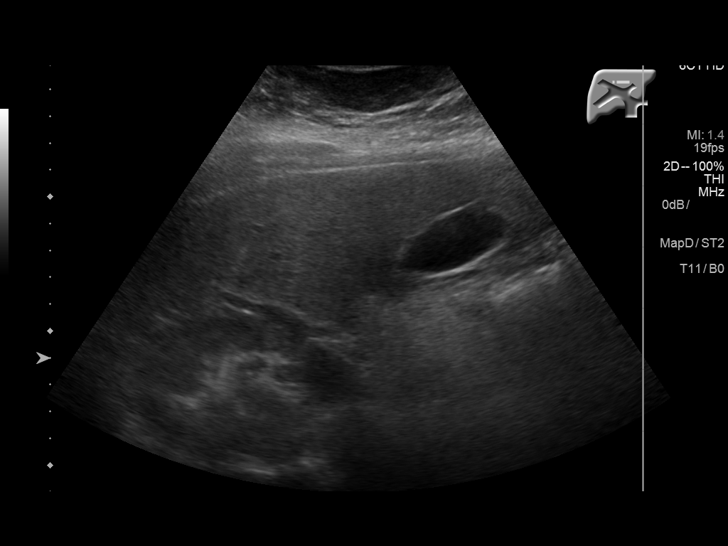
[im 47/47]
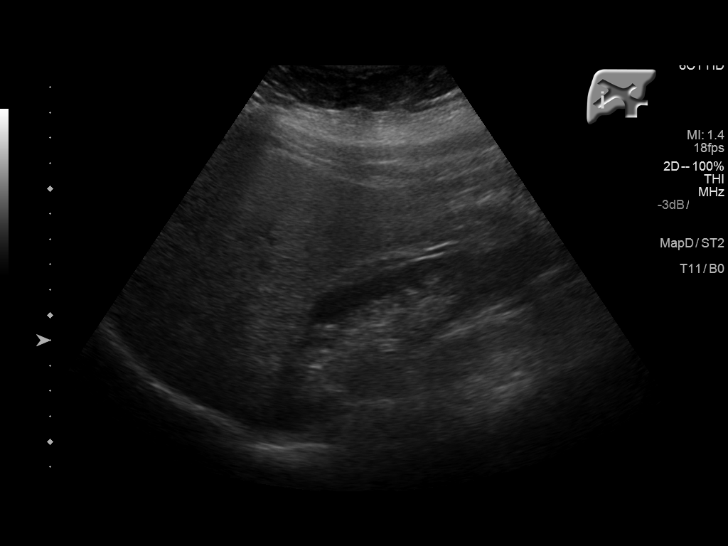

[14 of 25 positions shown; findings below may reference images not displayed]

FINDINGS: Gallbladder:

No gallstones seen. There is no gallbladder wall thickening,
pericholecystic fluid or other secondary sonographic signs of acute
cholecystitis.

Common bile duct:

Diameter: Normal at 3 mm.

Liver:

Diffusely echogenic indicating fatty infiltration. No focal mass or
lesion identified within the liver.
IMPRESSION: 1. No acute findings. No gallstones. No evidence of cholecystitis.
No bile duct dilatation.
2. Fatty infiltration of the liver.

## 2018-08-01 ENCOUNTER — Other Ambulatory Visit: Payer: Self-pay | Admitting: Student

## 2018-08-01 DIAGNOSIS — Z1231 Encounter for screening mammogram for malignant neoplasm of breast: Secondary | ICD-10-CM

## 2019-08-13 ENCOUNTER — Other Ambulatory Visit: Payer: Self-pay | Admitting: Student

## 2019-08-13 DIAGNOSIS — Z1231 Encounter for screening mammogram for malignant neoplasm of breast: Secondary | ICD-10-CM

## 2019-08-31 ENCOUNTER — Other Ambulatory Visit: Payer: Self-pay

## 2019-08-31 DIAGNOSIS — Z20822 Contact with and (suspected) exposure to covid-19: Secondary | ICD-10-CM

## 2019-09-02 LAB — NOVEL CORONAVIRUS, NAA: SARS-CoV-2, NAA: NOT DETECTED

## 2019-10-29 ENCOUNTER — Ambulatory Visit
Admission: RE | Admit: 2019-10-29 | Discharge: 2019-10-29 | Disposition: A | Discharge status: Home / Self Care | Payer: BC Managed Care – PPO | Source: Ambulatory Visit | Attending: Student | Admitting: Student

## 2019-10-29 DIAGNOSIS — Z1231 Encounter for screening mammogram for malignant neoplasm of breast: Secondary | ICD-10-CM | POA: Diagnosis not present

## 2021-02-01 ENCOUNTER — Other Ambulatory Visit: Payer: Self-pay | Admitting: Student

## 2021-02-02 ENCOUNTER — Other Ambulatory Visit: Payer: Self-pay | Admitting: Student

## 2021-02-28 ENCOUNTER — Other Ambulatory Visit: Payer: Self-pay | Admitting: Psychiatry

## 2021-03-21 ENCOUNTER — Other Ambulatory Visit: Payer: Self-pay | Admitting: Student

## 2021-04-01 ENCOUNTER — Other Ambulatory Visit: Payer: Self-pay

## 2021-04-01 MED FILL — Continuous Glucose System Sensor: 28 days supply | Qty: 1 | Fill #0 | Status: AC

## 2021-04-05 ENCOUNTER — Other Ambulatory Visit: Payer: Self-pay

## 2021-04-05 MED ORDER — PREDNISONE 10 MG PO TABS
ORAL_TABLET | ORAL | 0 refills | Status: DC
  Filled 2021-04-05: qty 10, 10d supply, fill #0

## 2021-04-05 MED ORDER — HYDROCODONE-HOMATROPINE 5-1.5 MG/5ML PO SYRP
ORAL_SOLUTION | ORAL | 0 refills | Status: DC
  Filled 2021-04-05: qty 120, 6d supply, fill #0

## 2021-04-10 ENCOUNTER — Other Ambulatory Visit: Payer: Self-pay

## 2021-04-20 ENCOUNTER — Other Ambulatory Visit: Payer: Self-pay

## 2021-04-20 MED FILL — Continuous Glucose System Sensor: 14 days supply | Qty: 1 | Fill #1 | Status: AC

## 2021-05-01 ENCOUNTER — Other Ambulatory Visit: Payer: Self-pay

## 2021-05-02 ENCOUNTER — Other Ambulatory Visit: Payer: Self-pay

## 2021-05-02 MED ORDER — PANTOPRAZOLE SODIUM 40 MG PO TBEC
DELAYED_RELEASE_TABLET | Freq: Every day | ORAL | 0 refills | Status: DC
  Filled 2021-05-02: qty 90, 90d supply, fill #0

## 2021-05-02 MED ORDER — LOVASTATIN 20 MG PO TABS
ORAL_TABLET | Freq: Every day | ORAL | 0 refills | Status: DC
  Filled 2021-05-02: qty 90, 90d supply, fill #0

## 2021-05-05 ENCOUNTER — Other Ambulatory Visit: Payer: Self-pay

## 2021-05-19 MED FILL — Continuous Glucose System Sensor: 14 days supply | Qty: 1 | Fill #2 | Status: AC

## 2021-05-22 ENCOUNTER — Other Ambulatory Visit: Payer: Self-pay

## 2021-05-31 ENCOUNTER — Other Ambulatory Visit: Payer: Self-pay

## 2021-05-31 MED ORDER — HYDROXYZINE PAMOATE 50 MG PO CAPS
ORAL_CAPSULE | ORAL | 0 refills | Status: DC
  Filled 2021-05-31: qty 180, 90d supply, fill #0

## 2021-05-31 MED ORDER — TRAZODONE HCL 100 MG PO TABS
ORAL_TABLET | ORAL | 0 refills | Status: DC
  Filled 2021-05-31: qty 90, 90d supply, fill #0

## 2021-05-31 MED ORDER — ZIPRASIDONE HCL 80 MG PO CAPS
ORAL_CAPSULE | ORAL | 0 refills | Status: DC
  Filled 2021-05-31: qty 90, 90d supply, fill #0

## 2021-06-01 ENCOUNTER — Other Ambulatory Visit: Payer: Self-pay

## 2021-06-06 ENCOUNTER — Other Ambulatory Visit: Payer: Self-pay

## 2021-06-09 ENCOUNTER — Other Ambulatory Visit: Payer: Self-pay

## 2021-06-12 ENCOUNTER — Other Ambulatory Visit: Payer: Self-pay

## 2021-06-12 MED ORDER — FREESTYLE LIBRE 2 SENSOR MISC
3 refills | Status: DC
  Filled 2021-06-12: qty 2, 28d supply, fill #0
  Filled 2021-07-31: qty 2, 28d supply, fill #1
  Filled 2021-08-28: qty 2, 28d supply, fill #2
  Filled 2021-10-16: qty 2, 28d supply, fill #3

## 2021-07-18 ENCOUNTER — Other Ambulatory Visit: Payer: Self-pay

## 2021-07-18 MED ORDER — GLIPIZIDE ER 10 MG PO TB24
10.0000 mg | ORAL_TABLET | Freq: Every day | ORAL | 1 refills | Status: DC
  Filled 2021-07-18 – 2021-07-19 (×2): qty 90, 90d supply, fill #0
  Filled 2021-11-17: qty 90, 90d supply, fill #1

## 2021-07-18 MED FILL — Glipizide Tab ER 24HR 5 MG: ORAL | 90 days supply | Qty: 90 | Fill #0 | Status: AC

## 2021-07-19 ENCOUNTER — Other Ambulatory Visit: Payer: Self-pay

## 2021-07-19 MED ORDER — PANTOPRAZOLE SODIUM 40 MG PO TBEC
DELAYED_RELEASE_TABLET | Freq: Every day | ORAL | 0 refills | Status: DC
  Filled 2021-07-19: qty 90, 90d supply, fill #0

## 2021-07-19 MED ORDER — LOVASTATIN 20 MG PO TABS
ORAL_TABLET | Freq: Every day | ORAL | 0 refills | Status: DC
  Filled 2021-07-19: qty 90, 90d supply, fill #0

## 2021-07-31 ENCOUNTER — Other Ambulatory Visit: Payer: Self-pay

## 2021-08-24 ENCOUNTER — Other Ambulatory Visit: Payer: Self-pay

## 2021-08-24 MED ORDER — METFORMIN HCL ER 500 MG PO TB24
ORAL_TABLET | ORAL | 2 refills | Status: DC
  Filled 2021-08-24: qty 30, 30d supply, fill #0

## 2021-08-28 ENCOUNTER — Other Ambulatory Visit: Payer: Self-pay

## 2021-08-29 ENCOUNTER — Other Ambulatory Visit: Payer: Self-pay

## 2021-08-31 ENCOUNTER — Other Ambulatory Visit: Payer: Self-pay

## 2021-08-31 MED ORDER — ZIPRASIDONE HCL 80 MG PO CAPS
ORAL_CAPSULE | ORAL | 0 refills | Status: DC
  Filled 2021-08-31: qty 90, 90d supply, fill #0

## 2021-08-31 MED ORDER — TRAZODONE HCL 150 MG PO TABS
ORAL_TABLET | ORAL | 0 refills | Status: DC
  Filled 2021-08-31: qty 90, 90d supply, fill #0

## 2021-09-01 ENCOUNTER — Other Ambulatory Visit: Payer: Self-pay

## 2021-09-05 ENCOUNTER — Other Ambulatory Visit: Payer: Self-pay

## 2021-09-05 MED ORDER — METFORMIN HCL ER 500 MG PO TB24
ORAL_TABLET | ORAL | 1 refills | Status: DC
  Filled 2021-09-05 – 2021-09-14 (×2): qty 180, 90d supply, fill #0
  Filled 2021-12-05: qty 180, 90d supply, fill #1

## 2021-09-05 MED ORDER — LATANOPROST 0.005 % OP SOLN
OPHTHALMIC | 3 refills | Status: DC
  Filled 2022-02-19: qty 7.5, 60d supply, fill #0
  Filled 2022-08-21: qty 7.5, 60d supply, fill #1

## 2021-09-14 ENCOUNTER — Other Ambulatory Visit: Payer: Self-pay

## 2021-09-21 ENCOUNTER — Other Ambulatory Visit: Payer: Self-pay

## 2021-09-21 ENCOUNTER — Other Ambulatory Visit: Payer: Self-pay | Admitting: Student

## 2021-09-21 DIAGNOSIS — Z1231 Encounter for screening mammogram for malignant neoplasm of breast: Secondary | ICD-10-CM

## 2021-09-21 MED ORDER — LOVASTATIN 20 MG PO TABS
ORAL_TABLET | ORAL | 3 refills | Status: DC
  Filled 2021-09-21 – 2021-11-03 (×2): qty 90, 90d supply, fill #0
  Filled 2022-02-13: qty 90, 90d supply, fill #1
  Filled 2022-04-18: qty 90, 90d supply, fill #2
  Filled 2022-07-30: qty 90, 90d supply, fill #3

## 2021-09-21 MED ORDER — VICTOZA 18 MG/3ML ~~LOC~~ SOPN
PEN_INJECTOR | SUBCUTANEOUS | 3 refills | Status: DC
  Filled 2021-09-21: qty 9, 30d supply, fill #0

## 2021-09-21 MED ORDER — GLIPIZIDE ER 10 MG PO TB24
10.0000 mg | ORAL_TABLET | Freq: Every day | ORAL | 3 refills | Status: DC
  Filled 2021-09-21: qty 90, 90d supply, fill #0

## 2021-09-21 MED ORDER — OZEMPIC (0.25 OR 0.5 MG/DOSE) 2 MG/1.5ML ~~LOC~~ SOPN
PEN_INJECTOR | SUBCUTANEOUS | 0 refills | Status: DC
  Filled 2021-09-21: qty 1.5, 28d supply, fill #0

## 2021-09-22 ENCOUNTER — Other Ambulatory Visit: Payer: Self-pay

## 2021-10-03 ENCOUNTER — Ambulatory Visit
Admission: RE | Admit: 2021-10-03 | Discharge: 2021-10-03 | Disposition: A | Discharge status: Home / Self Care | Payer: No Typology Code available for payment source | Source: Ambulatory Visit | Attending: Student | Admitting: Student

## 2021-10-03 ENCOUNTER — Encounter: Payer: No Typology Code available for payment source | Attending: Student | Admitting: *Deleted

## 2021-10-03 ENCOUNTER — Encounter: Payer: Self-pay | Admitting: *Deleted

## 2021-10-03 ENCOUNTER — Other Ambulatory Visit: Payer: Self-pay

## 2021-10-03 VITALS — BP 118/70 | Ht 62.0 in | Wt 257.9 lb

## 2021-10-03 DIAGNOSIS — Z1231 Encounter for screening mammogram for malignant neoplasm of breast: Secondary | ICD-10-CM | POA: Diagnosis present

## 2021-10-03 DIAGNOSIS — E119 Type 2 diabetes mellitus without complications: Secondary | ICD-10-CM

## 2021-10-03 NOTE — Progress Notes (Signed)
Diabetes Self-Management Education  Visit Type: First/Initial  Appt. Start Time: 1525 Appt. End Time: 1630  10/03/2021  [name], identified by name and date of birth, is a 45 y.o. female with a diagnosis of Diabetes: Type 2.   ASSESSMENT  Blood pressure 118/70, height 5\' 2"  (1.575 m), weight 257 lb 14.4 oz (117 kg). Body mass index is 47.17 kg/m.   Diabetes Self-Management Education - 10/03/21 1739       Visit Information   Visit Type First/Initial      Initial Visit   Diabetes Type Type 2    Are you currently following a meal plan? No    Are you taking your medications as prescribed? Yes    Date Diagnosed 1 year      Health Coping   How would you rate your overall health? Good      Psychosocial Assessment   Patient Belief/Attitude about Diabetes Motivated to manage diabetes    Self-care barriers None    Self-management support Doctor's office;Family    Patient Concerns Nutrition/Meal planning;Glycemic Control;Monitoring;Weight Control;Healthy Lifestyle    Special Needs None    Preferred Learning Style Auditory    Learning Readiness Ready    How often do you need to have someone help you when you read instructions, pamphlets, or other written materials from your doctor or pharmacy? 1 - Never    What is the last grade level you completed in school? some college      Pre-Education Assessment   Patient understands the diabetes disease and treatment process. Needs Review    Patient understands incorporating nutritional management into lifestyle. Needs Instruction    Patient undertands incorporating physical activity into lifestyle. Needs Instruction    Patient understands using medications safely. Needs Review    Patient understands monitoring blood glucose, interpreting and using results Needs Review    Patient understands prevention, detection, and treatment of acute complications. Needs Review    Patient understands prevention, detection, and treatment of  chronic complications. Needs Review    Patient understands how to develop strategies to address psychosocial issues. Needs Instruction    Patient understands how to develop strategies to promote health/change behavior. Needs Instruction      Complications   Last HgB A1C per patient/outside source 8.5 %   09/14/2021   How often do you check your blood sugar? 3-4 times/day   Wearing Libre 2. 14 day TIR 70-180 = 82%, >240 = 2%, 181-240 = 16%, less than 70 = 0%   Fasting Blood glucose range (mg/dL) 70-129;>200;130-179;180-200   FBG can range from 92-236 mg/dL   Postprandial Blood glucose range (mg/dL) 130-179;180-200   pp's 140-186 mg/dL   Have you had a dilated eye exam in the past 12 months? Yes    Have you had a dental exam in the past 12 months? No    Are you checking your feet? Yes    How many days per week are you checking your feet? 4      Dietary Intake   Breakfast works 7p - 7a - usually eats 1 meal/day and 2 snacks    Snack (morning) if not working - fruit (strawberries, grapes, honeydew, cantaloup)    Snack (afternoon) graham crackers    Dinner steak, chicken, fish, tacos, peas, beans, corn, pasta, salad with cuccumbers, eggs, spinach or romaine, tomatoes    Snack (evening) graham crackers, chips    Beverage(s) little water, sweet tea, sugar in coffee      Exercise  Exercise Type ADL's      Patient Education   Previous Diabetes Education No    Disease state  Definition of diabetes, type 1 and 2, and the diagnosis of diabetes;Factors that contribute to the development of diabetes;Explored patient's options for treatment of their diabetes    Nutrition management  Role of diet in the treatment of diabetes and the relationship between the three main macronutrients and blood glucose level;Food label reading, portion sizes and measuring food.;Reviewed blood glucose goals for pre and post meals and how to evaluate the patients' food intake on their blood glucose level.    Physical  activity and exercise  Role of exercise on diabetes management, blood pressure control and cardiac health.    Medications Reviewed patients medication for diabetes, action, purpose, timing of dose and side effects.    Monitoring Taught/discussed recording of test results and interpretation of SMBG.;Identified appropriate SMBG and/or A1C goals.;Other (comment)   Discussed TIR with CGM   Chronic complications Relationship between chronic complications and blood glucose control    Psychosocial adjustment Identified and addressed patients feelings and concerns about diabetes    Personal strategies to promote health Review risk of smoking and offered smoking cessation      Individualized Goals (developed by patient)   Reducing Risk Other (comment)   improve blood sugars, prevent diabetes complications, lose weight, lead a healthier lifestyle, quit smoking     Outcomes   Expected Outcomes Demonstrated interest in learning. Expect positive outcomes    Future DMSE 4-6 wks        Individualized Plan for Diabetes Self-Management Training:   Learning Objective:  Patient will have a greater understanding of diabetes self-management. Patient education plan is to attend individual and/or group sessions per assessed needs and concerns.   Plan:   Patient Instructions  Check blood sugars 2 x day before breakfast and 2 hrs after one meal every day or as needed with FreeStyle Libre Bring blood sugar records to the next class  Exercise: Begin walking  for    15  minutes   3  days a week and gradually increase to 30 minutes 5 x week  Eat 3 meals day,   1-2  snacks a day Space meals 4-6 hours apart Don't skip meals Avoid sugar sweetened drinks (tea, coffee,  juices) Increase water intake to at least 3 - 4 servings/day  Expected Outcomes:  Demonstrated interest in learning. Expect positive outcomes  Education material provided:  General Meal Planning Guidelines Simple Meal Plan  If problems or  questions, patient to contact team via:   Johny Drilling, RN, [place] [phone]  Future DSME appointment: 4-6 wks November 13, 2021 for Diabetes Class 1

## 2021-10-03 NOTE — Patient Instructions (Signed)
Check blood sugars 2 x day before breakfast and 2 hrs after one meal every day or as needed with FreeStyle Libre Bring blood sugar records to the next class  Exercise: Begin walking  for    15  minutes   3  days a week and gradually increase to 30 minutes 5 x week  Eat 3 meals day,   1-2  snacks a day Space meals 4-6 hours apart Don't skip meals Avoid sugar sweetened drinks (tea, coffee,  juices) Increase water intake to at least 3 - 4 servings/day  Return for classes on:

## 2021-10-16 ENCOUNTER — Other Ambulatory Visit: Payer: Self-pay

## 2021-10-31 ENCOUNTER — Other Ambulatory Visit: Payer: Self-pay

## 2021-10-31 MED ORDER — BACLOFEN 10 MG PO TABS
ORAL_TABLET | ORAL | 0 refills | Status: DC
  Filled 2021-10-31: qty 15, 5d supply, fill #0

## 2021-10-31 MED ORDER — TRAMADOL HCL 50 MG PO TABS
ORAL_TABLET | ORAL | 0 refills | Status: DC
  Filled 2021-10-31: qty 20, 5d supply, fill #0

## 2021-11-01 ENCOUNTER — Other Ambulatory Visit: Payer: Self-pay

## 2021-11-02 ENCOUNTER — Other Ambulatory Visit: Payer: Self-pay

## 2021-11-02 MED ORDER — OZEMPIC (0.25 OR 0.5 MG/DOSE) 2 MG/1.5ML ~~LOC~~ SOPN
PEN_INJECTOR | SUBCUTANEOUS | 1 refills | Status: DC
  Filled 2021-11-02: qty 1.5, 28d supply, fill #0

## 2021-11-02 MED ORDER — PANTOPRAZOLE SODIUM 40 MG PO TBEC
DELAYED_RELEASE_TABLET | Freq: Every day | ORAL | 0 refills | Status: DC
  Filled 2021-11-02: qty 90, 90d supply, fill #0

## 2021-11-02 MED ORDER — FREESTYLE LIBRE 2 SENSOR MISC
3 refills | Status: DC
  Filled 2021-11-02 – 2021-12-05 (×2): qty 2, 28d supply, fill #0
  Filled 2022-02-13: qty 2, 28d supply, fill #1
  Filled 2022-03-27: qty 2, 28d supply, fill #2
  Filled 2022-04-30: qty 2, 28d supply, fill #3

## 2021-11-03 ENCOUNTER — Other Ambulatory Visit: Payer: Self-pay

## 2021-11-06 ENCOUNTER — Other Ambulatory Visit: Payer: Self-pay

## 2021-11-06 MED ORDER — ETODOLAC 500 MG PO TABS
ORAL_TABLET | ORAL | 0 refills | Status: DC
  Filled 2021-11-06: qty 60, 30d supply, fill #0

## 2021-11-13 ENCOUNTER — Ambulatory Visit: Payer: No Typology Code available for payment source

## 2021-11-16 ENCOUNTER — Ambulatory Visit: Payer: No Typology Code available for payment source

## 2021-11-17 ENCOUNTER — Other Ambulatory Visit: Payer: Self-pay

## 2021-11-20 ENCOUNTER — Other Ambulatory Visit: Payer: Self-pay

## 2021-11-27 ENCOUNTER — Other Ambulatory Visit: Payer: Self-pay

## 2021-11-27 MED ORDER — OZEMPIC (1 MG/DOSE) 4 MG/3ML ~~LOC~~ SOPN
PEN_INJECTOR | SUBCUTANEOUS | 5 refills | Status: DC
  Filled 2021-11-27: qty 3, 28d supply, fill #0

## 2021-12-04 ENCOUNTER — Other Ambulatory Visit: Payer: Self-pay

## 2021-12-04 MED ORDER — TRAZODONE HCL 150 MG PO TABS
ORAL_TABLET | ORAL | 0 refills | Status: DC
  Filled 2021-12-04: qty 90, 90d supply, fill #0

## 2021-12-04 MED ORDER — ZIPRASIDONE HCL 80 MG PO CAPS
ORAL_CAPSULE | ORAL | 0 refills | Status: DC
  Filled 2021-12-04: qty 90, 90d supply, fill #0

## 2021-12-05 ENCOUNTER — Other Ambulatory Visit: Payer: Self-pay

## 2021-12-11 ENCOUNTER — Ambulatory Visit: Payer: No Typology Code available for payment source

## 2021-12-15 ENCOUNTER — Encounter: Payer: Self-pay | Admitting: *Deleted

## 2021-12-26 ENCOUNTER — Other Ambulatory Visit: Payer: Self-pay

## 2021-12-26 MED ORDER — FAMOTIDINE 20 MG PO TABS
20.0000 mg | ORAL_TABLET | Freq: Two times a day (BID) | ORAL | 1 refills | Status: DC
  Filled 2021-12-26: qty 60, 30d supply, fill #0
  Filled 2022-02-19: qty 60, 30d supply, fill #1

## 2021-12-26 MED ORDER — OZEMPIC (1 MG/DOSE) 4 MG/3ML ~~LOC~~ SOPN
PEN_INJECTOR | SUBCUTANEOUS | 1 refills | Status: DC
  Filled 2021-12-26: qty 9, 84d supply, fill #0

## 2021-12-26 MED ORDER — ONDANSETRON 4 MG PO TBDP
ORAL_TABLET | ORAL | 0 refills | Status: DC
  Filled 2021-12-26: qty 10, 30d supply, fill #0

## 2022-01-03 ENCOUNTER — Emergency Department (HOSPITAL_BASED_OUTPATIENT_CLINIC_OR_DEPARTMENT_OTHER)
Admission: EM | Admit: 2022-01-03 | Discharge: 2022-01-03 | Disposition: A | Discharge status: Home / Self Care | Payer: No Typology Code available for payment source | Attending: Emergency Medicine | Admitting: Emergency Medicine

## 2022-01-03 ENCOUNTER — Other Ambulatory Visit: Payer: Self-pay

## 2022-01-03 ENCOUNTER — Emergency Department (HOSPITAL_BASED_OUTPATIENT_CLINIC_OR_DEPARTMENT_OTHER): Payer: No Typology Code available for payment source | Admitting: Radiology

## 2022-01-03 ENCOUNTER — Encounter (HOSPITAL_BASED_OUTPATIENT_CLINIC_OR_DEPARTMENT_OTHER): Payer: Self-pay | Admitting: Emergency Medicine

## 2022-01-03 DIAGNOSIS — Z91040 Latex allergy status: Secondary | ICD-10-CM | POA: Diagnosis not present

## 2022-01-03 DIAGNOSIS — K5900 Constipation, unspecified: Secondary | ICD-10-CM | POA: Insufficient documentation

## 2022-01-03 DIAGNOSIS — R1030 Lower abdominal pain, unspecified: Secondary | ICD-10-CM | POA: Insufficient documentation

## 2022-01-03 DIAGNOSIS — R110 Nausea: Secondary | ICD-10-CM | POA: Diagnosis not present

## 2022-01-03 DIAGNOSIS — K5641 Fecal impaction: Secondary | ICD-10-CM | POA: Diagnosis not present

## 2022-01-03 LAB — CBC WITH DIFFERENTIAL/PLATELET
Abs Immature Granulocytes: 0.03 10*3/uL (ref 0.00–0.07)
Basophils Absolute: 0 10*3/uL (ref 0.0–0.1)
Basophils Relative: 0 %
Eosinophils Absolute: 0.2 10*3/uL (ref 0.0–0.5)
Eosinophils Relative: 2 %
HCT: 45.3 % (ref 36.0–46.0)
Hemoglobin: 15.2 g/dL — ABNORMAL HIGH (ref 12.0–15.0)
Immature Granulocytes: 0 %
Lymphocytes Relative: 31 %
Lymphs Abs: 2.9 10*3/uL (ref 0.7–4.0)
MCH: 30.2 pg (ref 26.0–34.0)
MCHC: 33.6 g/dL (ref 30.0–36.0)
MCV: 90.1 fL (ref 80.0–100.0)
Monocytes Absolute: 0.8 10*3/uL (ref 0.1–1.0)
Monocytes Relative: 8 %
Neutro Abs: 5.4 10*3/uL (ref 1.7–7.7)
Neutrophils Relative %: 59 %
Platelets: 242 10*3/uL (ref 150–400)
RBC: 5.03 MIL/uL (ref 3.87–5.11)
RDW: 12.8 % (ref 11.5–15.5)
WBC: 9.3 10*3/uL (ref 4.0–10.5)
nRBC: 0 % (ref 0.0–0.2)

## 2022-01-03 LAB — COMPREHENSIVE METABOLIC PANEL
ALT: 27 U/L (ref 0–44)
AST: 16 U/L (ref 15–41)
Albumin: 4.1 g/dL (ref 3.5–5.0)
Alkaline Phosphatase: 62 U/L (ref 38–126)
Anion gap: 8 (ref 5–15)
BUN: 7 mg/dL (ref 6–20)
CO2: 28 mmol/L (ref 22–32)
Calcium: 9 mg/dL (ref 8.9–10.3)
Chloride: 105 mmol/L (ref 98–111)
Creatinine, Ser: 0.85 mg/dL (ref 0.44–1.00)
GFR, Estimated: >60 mL/min (ref >60)
Glucose, Bld: 97 mg/dL (ref 70–99)
Potassium: 3.5 mmol/L (ref 3.5–5.1)
Sodium: 141 mmol/L (ref 135–145)
Total Bilirubin: 0.5 mg/dL (ref 0.3–1.2)
Total Protein: 7.1 g/dL (ref 6.5–8.1)

## 2022-01-03 LAB — LIPASE, BLOOD: Lipase: 10 U/L — ABNORMAL LOW (ref 11–51)

## 2022-01-03 MED ORDER — BISACODYL 5 MG PO TBEC
10.0000 mg | DELAYED_RELEASE_TABLET | Freq: Once | ORAL | Status: AC
Start: 2022-01-03 — End: 2022-01-03
  Administered 2022-01-03: 10 mg via ORAL
  Filled 2022-01-03: qty 2

## 2022-01-03 MED ORDER — FENTANYL CITRATE PF 50 MCG/ML IJ SOSY
75.0000 ug | PREFILLED_SYRINGE | Freq: Once | INTRAMUSCULAR | Status: AC
  Administered 2022-01-03: 75 ug via INTRAMUSCULAR
  Filled 2022-01-03: qty 2

## 2022-01-03 NOTE — ED Provider Notes (Signed)
[facility] EMERGENCY DEPT Provider Note   CSN: [id number] Arrival date & time: 01/03/22  1025     History  Chief Complaint  Patient presents with   Constipation    Monica Dominguez is a 46 y.o. female with a past medical history significant for diabetes who presents with 2 weeks of constipation, lower abdominal pain, nausea.  Patient reports that she attempted to disimpact herself at home, but was unable to, and upset her hemorrhoids.  Patient reports that she recently was recovering from the flu, had not been able to take her normal medications including her baseline constipation medication secondary to nausea.  Patient reports that she began taking Zofran which she read also worsens constipation.  Patient reports that she has had some passage of wet stool around hard stool ball but reports that this is painful overall.  Patient reports that she has had decreased flatus.  Patient reports that she does have a history of constipation but never this bad.  Patient denies any dysuria, hematuria, lower leg edema.  Patient denies any opioid use.  Patient denies any recent or previous spinal cord injury.  Patient does report that she has had a surgical history of a hemorrhoid removal and repair, and reports that her constipation has been worse since this event.   Constipation     Home Medications Prior to Admission medications   Medication Sig Start Date End Date Taking? Authorizing Provider  albuterol (VENTOLIN HFA) 108 (90 Base) MCG/ACT inhaler Inhale 2 puffs into the lungs every 4 (four) hours as needed. Patient not taking: Reported on 10/03/2021 08/10/20   [provider]  baclofen (LIORESAL) 10 MG tablet Take 1 tablet (10 mg total) by mouth 3 (three) times daily for 30 days 10/31/21     cetirizine (ZYRTEC) 10 MG tablet Take 10 mg by mouth daily as needed. Reported on 07/13/2016    [provider]  clonazePAM (KLONOPIN) 0.5 MG tablet Take 0.5 mg by mouth daily as  needed.    [provider]  Continuous Blood Gluc Receiver (FREESTYLE LIBRE 2 READER) DEVI USE 1 DEVICE AS DIRECTED 03/21/21 03/21/22  Wayland Denis, PA-C  Continuous Blood Gluc Sensor (FREESTYLE LIBRE 2 SENSOR) MISC USE 1 KIT EVERY 14 (FOURTEEN) DAYS FOR GLUCOSE MONITORING 11/02/21     etodolac (LODINE) 500 MG tablet Take 1 tablet (500 mg total) by mouth 2 (two) times daily as needed 11/06/21     famotidine (PEPCID) 20 MG tablet Take 1 tablet (20 mg total) by mouth 2 (two) times daily 12/26/21     fluticasone (FLONASE) 50 MCG/ACT nasal spray Place daily into both nostrils. Patient not taking: Reported on 10/03/2021    [provider]  glipiZIDE (GLUCOTROL XL) 10 MG 24 hr tablet Take 1 tablet (10 mg total) by mouth once daily 07/18/21     hydrOXYzine (VISTARIL) 50 MG capsule Take 1 tablet by mouth twice daily as needed 05/31/21     latanoprost (XALATAN) 0.005 % ophthalmic solution Instill 1 drop in both eyes daily 08/21/21     lovastatin (MEVACOR) 20 MG tablet Take 1 tablet (20 mg total) by mouth once daily 09/21/21     metFORMIN (GLUCOPHAGE-XR) 500 MG 24 hr tablet Take 2 tablets (1,000 mg total) by mouth daily with dinner 09/05/21     ondansetron (ZOFRAN-ODT) 4 MG disintegrating tablet Dissolve 1 tablet (4 mg total in the mouth every 8 (eight) hours as needed 12/26/21     pantoprazole (PROTONIX) 40 MG tablet TAKE 1  TABLET BY MOUTH ONCE DAILY 11/02/21 11/02/22    Semaglutide, 1 MG/DOSE, (OZEMPIC, 1 MG/DOSE,) 4 MG/3ML SOPN Inject 0.75 ml (1 mg total) subcutaneously once a week. 11/27/21     Semaglutide, 1 MG/DOSE, (OZEMPIC, 1 MG/DOSE,) 4 MG/3ML SOPN Inject 72m subcutaneously once a week 12/26/21     Semaglutide,0.25 or 0.5MG/DOS, (OZEMPIC, 0.25 OR 0.5 MG/DOSE,) 2 MG/1.5ML SOPN Inject 0.375 mLs (0.5 mg total) subcutaneously once a week for 30 days 11/02/21     traMADol (ULTRAM) 50 MG tablet Take 1 tablet (50 mg total) by mouth every 6 (six) hours as needed for up to 10 days 10/31/21      traZODone (DESYREL) 100 MG tablet Take 100 mg by mouth at bedtime as needed. 02/28/21   [provider]  traZODone (DESYREL) 150 MG tablet Take 1 at bedtime 12/04/21     ziprasidone (GEODON) 80 MG capsule TAKE 1 CAPSULE BY MOUTH EVERY EVENING WITH DINNER 02/28/21 02/28/22  KChauncey Mann MD  ziprasidone (GEODON) 80 MG capsule Take 1 dailywith dinner 12/04/21         Allergies    Latex and Lamictal [lamotrigine]    Review of Systems   Review of Systems  Gastrointestinal:  Positive for constipation.  All other systems reviewed and are negative.  Physical Exam Updated Vital Signs BP 123/60 (BP Location: Right Arm)    Pulse 80    Temp 98.7 F (37.1 C) (Oral)    Resp 16    Ht 5' (1.524 m)    Wt 112.5 kg    SpO2 98%    BMI 48.43 kg/m  Physical Exam Vitals and nursing note reviewed.  Constitutional:      General: She is not in acute distress.    Appearance: Normal appearance. She is obese.  HENT:     Head: Normocephalic and atraumatic.  Eyes:     General:        Right eye: No discharge.        Left eye: No discharge.  Cardiovascular:     Rate and Rhythm: Normal rate and regular rhythm.     Heart sounds: No murmur heard.   No friction rub. No gallop.  Pulmonary:     Effort: Pulmonary effort is normal.     Breath sounds: Normal breath sounds.  Abdominal:     General: Bowel sounds are normal.     Palpations: Abdomen is soft.     Comments: Patient has some generalized tenderness palpation of the abdomen, no focal tenderness throughout.  No rebound, rigidity, guarding.  No evidence of ecchymosis, or other abnormality on the skin surface.  No clear evidence of distention, and normal bowel sounds throughout.  Genitourinary:    Comments: Patient has normal-appearing rectum with evidence of some hemorrhoids, there is a large hard stool mass in her rectal vault.  I am able to clear a large amount of the stool out of her rectal vault on fecal disimpaction.  Some bright red blood noted  during exam, but no active bleeding from rectum after exam has been completed. Skin:    General: Skin is warm and dry.     Capillary Refill: Capillary refill takes less than 2 seconds.  Neurological:     Mental Status: She is alert and oriented to person, place, and time.  Psychiatric:        Mood and Affect: Mood normal.        Behavior: Behavior normal.    ED Results / Procedures /  Treatments   Labs (all labs ordered are listed, but only abnormal results are displayed) Labs Reviewed  CBC WITH DIFFERENTIAL/PLATELET - Abnormal; Notable for the following components:      Result Value   Hemoglobin 15.2 (*)    All other components within normal limits  LIPASE, BLOOD - Abnormal; Notable for the following components:   Lipase 10 (*)    All other components within normal limits  COMPREHENSIVE METABOLIC PANEL    EKG None  Radiology DG Abdomen 1 View  Result Date: 01/03/2022 CLINICAL DATA:  A 46 year old female presents with constipation for 2 weeks. EXAM: ABDOMEN - 1 VIEW COMPARISON:  None FINDINGS: Moderate stool in the ascending and transverse colon. No signs of bowel dilation. Upper abdomen excluded from view. No abnormal calcification over renal contours., abdomen visualized from the top of L1 through the pelvis. No signs of fecal impaction by radiograph. IMPRESSION: Moderate stool in the ascending and transverse colon. No evidence of bowel obstruction or signs of fecal impaction. Upper abdomen excluded from view on current imaging. Electronically Signed   By: Zetta Bills M.D.   On: 01/03/2022 13:05    Procedures Fecal disimpaction  Date/Time: 01/03/2022 2:22 PM Performed by: Anselmo Pickler, PA-C Authorized by: Anselmo Pickler, PA-C  Consent: Verbal consent obtained. Consent given by: patient Patient understanding: patient states understanding of the procedure being performed Patient consent: the patient's understanding of the procedure matches consent  given Procedure consent: procedure consent matches procedure scheduled Imaging studies: imaging studies available Patient identity confirmed: verbally with patient Local anesthesia used: no  Anesthesia: Local anesthesia used: no  Sedation: Patient sedated: no  Patient tolerance: patient tolerated the procedure well with no immediate complications    Medications Ordered in ED Medications  bisacodyl (DULCOLAX) EC tablet 10 mg (10 mg Oral Given 01/03/22 1313)  fentaNYL (SUBLIMAZE) injection 75 mcg (75 mcg Intramuscular Given 01/03/22 1412)    ED Course/ Medical Decision Making/ A&P                           Medical Decision Making  Overall well-appearing patient has had 2 weeks of constipation, with many trialed options.  My differential includes constipation, abdominal pain of other origin, chronic opioid use, bowel obstruction, malignant rectal mass.  Given the patient denies opioid use, no history of opioid use on file, history of chronic constipation, and abdominal x-ray which does show large stool burden does appear that constipation is patient's concern today.  Screening lab work shows no abnormalities, no other evidence of acute intra-abdominal abnormality.  I do not believe that patient has an acute or surgical abdomen at this time.  Her radiographic imaging does not show any air-fluid levels or any other evidence of an obstruction.  I agree with the radiographic interpretation.  In this context I will administer Dulcolax tablet, and perform fecal disimpaction.  Fecal disimpaction performed as described above.  Some hemorrhoids noted, but no evidence of acute GI bleed or active bleeding after procedure is complete.  Patient tolerated the procedure with some pain.  Encouraged continued use of fluids to be 64 ounces of water a day, fiber, MiraLAX, enemas, and suppositories.  Encouraged follow-up if patient becomes unable to pass stool especially if she is unable to pass flatus.  Patient  discharged stable condition, return precautions given at this time.  Final Clinical Impression(s) / ED Diagnoses Final diagnoses:  Constipation, unspecified constipation type  Impacted stool in rectum (  [facility])    Rx / DC Orders ED Discharge Orders     None         Dorien Chihuahua 01/03/22 1425    Regan Lemming, MD 01/03/22 Kathyrn Drown

## 2022-01-03 NOTE — Discharge Instructions (Addendum)
As we discussed I did feel some impacted stool, which I believe that it cleared out the majority of.  Please continue to drink plenty of fluids at least 64 ounces water a day, take fiber tablets, you can take MiraLAX as directed at least twice a day.  Continue using suppositories and enemas as needed.  Please follow-up if you become unable to have a bowel movement despite trying, or have worsening pain.  Especially after fecal disimpaction I do expect you to have some significant pain for a couple of days secondary to the digital trauma.  I appreciate your patience today, it was a pleasure taking care of you.

## 2022-01-03 NOTE — ED Triage Notes (Signed)
Constipation x 2 weeks , Hx of it. Pt reports no relief despite laxatives . Lower abdominal pain , nausea. Feels impacted .

## 2022-01-11 ENCOUNTER — Other Ambulatory Visit: Payer: Self-pay

## 2022-01-11 MED ORDER — PROMETHAZINE HCL 25 MG PO TABS
ORAL_TABLET | ORAL | 0 refills | Status: DC
  Filled 2022-01-11: qty 15, 7d supply, fill #0

## 2022-02-13 ENCOUNTER — Other Ambulatory Visit: Payer: Self-pay

## 2022-02-13 MED ORDER — PANTOPRAZOLE SODIUM 40 MG PO TBEC
DELAYED_RELEASE_TABLET | Freq: Every day | ORAL | 0 refills | Status: DC
  Filled 2022-02-13: qty 90, 90d supply, fill #0

## 2022-02-19 ENCOUNTER — Other Ambulatory Visit: Payer: Self-pay

## 2022-02-20 ENCOUNTER — Other Ambulatory Visit: Payer: Self-pay

## 2022-02-20 MED ORDER — GLIPIZIDE ER 10 MG PO TB24
10.0000 mg | ORAL_TABLET | Freq: Every day | ORAL | 2 refills | Status: DC
  Filled 2022-02-20: qty 90, 90d supply, fill #0
  Filled 2022-04-18: qty 90, 90d supply, fill #1

## 2022-03-06 ENCOUNTER — Other Ambulatory Visit: Payer: Self-pay

## 2022-03-06 MED ORDER — TRAZODONE HCL 150 MG PO TABS
ORAL_TABLET | ORAL | 0 refills | Status: DC
  Filled 2022-03-06: qty 90, 90d supply, fill #0

## 2022-03-06 MED ORDER — ZIPRASIDONE HCL 80 MG PO CAPS
ORAL_CAPSULE | ORAL | 0 refills | Status: DC
  Filled 2022-03-06: qty 90, 90d supply, fill #0

## 2022-03-06 MED ORDER — HYDROXYZINE PAMOATE 50 MG PO CAPS
ORAL_CAPSULE | ORAL | 0 refills | Status: DC
  Filled 2022-03-06: qty 90, 90d supply, fill #0

## 2022-03-07 ENCOUNTER — Other Ambulatory Visit: Payer: Self-pay

## 2022-03-27 ENCOUNTER — Other Ambulatory Visit: Payer: Self-pay

## 2022-04-09 ENCOUNTER — Other Ambulatory Visit: Payer: Self-pay

## 2022-04-09 MED ORDER — MOUNJARO 2.5 MG/0.5ML ~~LOC~~ SOAJ
SUBCUTANEOUS | 0 refills | Status: DC
  Filled 2022-04-09 – 2022-04-13 (×3): qty 2, 28d supply, fill #0

## 2022-04-09 MED ORDER — MOUNJARO 5 MG/0.5ML ~~LOC~~ SOAJ
SUBCUTANEOUS | 1 refills | Status: DC
  Filled 2022-04-30: qty 2, 28d supply, fill #0
  Filled 2022-05-30: qty 2, 28d supply, fill #1

## 2022-04-10 ENCOUNTER — Other Ambulatory Visit: Payer: Self-pay

## 2022-04-13 ENCOUNTER — Other Ambulatory Visit: Payer: Self-pay

## 2022-04-18 ENCOUNTER — Other Ambulatory Visit: Payer: Self-pay

## 2022-04-19 ENCOUNTER — Other Ambulatory Visit: Payer: Self-pay

## 2022-04-19 MED ORDER — METFORMIN HCL ER 500 MG PO TB24
ORAL_TABLET | ORAL | 0 refills | Status: DC
  Filled 2022-04-19: qty 180, 90d supply, fill #0

## 2022-04-25 ENCOUNTER — Other Ambulatory Visit: Payer: Self-pay

## 2022-04-26 ENCOUNTER — Other Ambulatory Visit: Payer: Self-pay

## 2022-04-27 ENCOUNTER — Other Ambulatory Visit: Payer: Self-pay

## 2022-04-27 MED ORDER — FAMOTIDINE 20 MG PO TABS
20.0000 mg | ORAL_TABLET | Freq: Two times a day (BID) | ORAL | 1 refills | Status: DC
  Filled 2022-04-27: qty 60, 30d supply, fill #0
  Filled 2022-06-27: qty 60, 30d supply, fill #1

## 2022-04-27 MED ORDER — PANTOPRAZOLE SODIUM 40 MG PO TBEC
DELAYED_RELEASE_TABLET | Freq: Every day | ORAL | 0 refills | Status: DC
  Filled 2022-04-27: qty 90, 90d supply, fill #0

## 2022-04-30 ENCOUNTER — Other Ambulatory Visit: Payer: Self-pay

## 2022-05-01 ENCOUNTER — Other Ambulatory Visit: Payer: Self-pay

## 2022-05-08 ENCOUNTER — Other Ambulatory Visit: Payer: Self-pay

## 2022-05-22 ENCOUNTER — Other Ambulatory Visit: Payer: Self-pay

## 2022-05-22 MED ORDER — PREDNISONE 10 MG PO TABS
ORAL_TABLET | ORAL | 0 refills | Status: DC
  Filled 2022-05-22: qty 10, 10d supply, fill #0

## 2022-05-22 MED ORDER — AMOXICILLIN 875 MG PO TABS
ORAL_TABLET | ORAL | 0 refills | Status: DC
  Filled 2022-05-22: qty 20, 10d supply, fill #0

## 2022-05-30 ENCOUNTER — Other Ambulatory Visit: Payer: Self-pay

## 2022-05-30 MED ORDER — FREESTYLE LIBRE 2 SENSOR MISC
3 refills | Status: AC
  Filled 2022-05-30: qty 2, 28d supply, fill #0
  Filled 2022-09-26: qty 2, 28d supply, fill #1
  Filled 2023-01-13: qty 2, 28d supply, fill #2

## 2022-06-06 ENCOUNTER — Other Ambulatory Visit: Payer: Self-pay

## 2022-06-06 MED ORDER — LURASIDONE HCL 80 MG PO TABS
ORAL_TABLET | ORAL | 0 refills | Status: DC
  Filled 2022-06-06 – 2022-06-08 (×5): qty 90, 90d supply, fill #0

## 2022-06-06 MED ORDER — TRAZODONE HCL 150 MG PO TABS
ORAL_TABLET | ORAL | 0 refills | Status: DC
  Filled 2022-06-06: qty 90, 90d supply, fill #0

## 2022-06-06 MED ORDER — HYDROXYZINE PAMOATE 50 MG PO CAPS
ORAL_CAPSULE | ORAL | 0 refills | Status: DC
  Filled 2022-06-06: qty 90, 90d supply, fill #0

## 2022-06-07 ENCOUNTER — Other Ambulatory Visit: Payer: Self-pay

## 2022-06-08 ENCOUNTER — Other Ambulatory Visit: Payer: Self-pay

## 2022-06-19 ENCOUNTER — Other Ambulatory Visit: Payer: Self-pay

## 2022-06-19 MED ORDER — CYCLOBENZAPRINE HCL 5 MG PO TABS
ORAL_TABLET | ORAL | 0 refills | Status: DC
  Filled 2022-06-19: qty 30, 30d supply, fill #0

## 2022-06-27 ENCOUNTER — Other Ambulatory Visit: Payer: Self-pay

## 2022-06-28 ENCOUNTER — Other Ambulatory Visit: Payer: Self-pay

## 2022-06-28 MED ORDER — TIRZEPATIDE 5 MG/0.5ML ~~LOC~~ SOAJ
SUBCUTANEOUS | 1 refills | Status: DC
  Filled 2022-06-28: qty 2, 28d supply, fill #0
  Filled 2022-07-24: qty 2, 28d supply, fill #1

## 2022-07-05 ENCOUNTER — Other Ambulatory Visit: Payer: Self-pay

## 2022-07-05 MED ORDER — TRAZODONE HCL 150 MG PO TABS
ORAL_TABLET | ORAL | 0 refills | Status: DC
  Filled 2022-07-05: qty 90, 90d supply, fill #0

## 2022-07-05 MED ORDER — HYDROXYZINE PAMOATE 50 MG PO CAPS
ORAL_CAPSULE | ORAL | 0 refills | Status: DC
  Filled 2022-07-05: qty 90, 90d supply, fill #0

## 2022-07-05 MED ORDER — LURASIDONE HCL 80 MG PO TABS
ORAL_TABLET | ORAL | 0 refills | Status: DC
  Filled 2022-07-05 – 2022-08-15 (×2): qty 90, 90d supply, fill #0

## 2022-07-06 ENCOUNTER — Other Ambulatory Visit: Payer: Self-pay

## 2022-07-09 ENCOUNTER — Other Ambulatory Visit: Payer: Self-pay

## 2022-07-09 MED ORDER — NYSTATIN 100000 UNIT/GM EX CREA
TOPICAL_CREAM | CUTANEOUS | 2 refills | Status: DC
  Filled 2022-07-09: qty 30, 7d supply, fill #0

## 2022-07-24 ENCOUNTER — Other Ambulatory Visit: Payer: Self-pay

## 2022-07-25 ENCOUNTER — Other Ambulatory Visit: Payer: Self-pay

## 2022-07-30 ENCOUNTER — Other Ambulatory Visit: Payer: Self-pay

## 2022-07-30 MED ORDER — PANTOPRAZOLE SODIUM 40 MG PO TBEC
DELAYED_RELEASE_TABLET | ORAL | 0 refills | Status: DC
  Filled 2022-07-30: qty 90, 90d supply, fill #0

## 2022-08-15 ENCOUNTER — Other Ambulatory Visit: Payer: Self-pay

## 2022-08-16 ENCOUNTER — Other Ambulatory Visit: Payer: Self-pay

## 2022-08-17 ENCOUNTER — Other Ambulatory Visit: Payer: Self-pay

## 2022-08-19 ENCOUNTER — Other Ambulatory Visit: Payer: Self-pay

## 2022-08-20 ENCOUNTER — Other Ambulatory Visit: Payer: Self-pay

## 2022-08-20 MED ORDER — MOUNJARO 5 MG/0.5ML ~~LOC~~ SOAJ
SUBCUTANEOUS | 0 refills | Status: DC
  Filled 2022-08-20: qty 2, 28d supply, fill #0
  Filled 2022-09-26: qty 2, 28d supply, fill #1
  Filled 2022-10-31: qty 2, 28d supply, fill #2

## 2022-08-21 ENCOUNTER — Other Ambulatory Visit: Payer: Self-pay

## 2022-09-26 ENCOUNTER — Other Ambulatory Visit: Payer: Self-pay

## 2022-09-26 MED ORDER — LATANOPROST 0.005 % OP SOLN
OPHTHALMIC | 1 refills | Status: AC
  Filled 2022-09-26 (×2): qty 5, 50d supply, fill #0
  Filled 2023-02-03: qty 5, 50d supply, fill #1

## 2022-10-01 ENCOUNTER — Other Ambulatory Visit: Payer: Self-pay

## 2022-10-01 MED ORDER — HYDROXYZINE PAMOATE 50 MG PO CAPS
50.0000 mg | ORAL_CAPSULE | Freq: Every day | ORAL | 0 refills | Status: DC | PRN
  Filled 2022-10-01: qty 90, 90d supply, fill #0

## 2022-10-01 MED ORDER — TRAZODONE HCL 150 MG PO TABS
150.0000 mg | ORAL_TABLET | Freq: Every day | ORAL | 0 refills | Status: DC
  Filled 2022-10-01: qty 5, 5d supply, fill #0
  Filled 2022-10-01: qty 85, 85d supply, fill #0

## 2022-10-01 MED ORDER — LURASIDONE HCL 80 MG PO TABS
80.0000 mg | ORAL_TABLET | Freq: Every day | ORAL | 0 refills | Status: DC
  Filled 2022-10-01 – 2022-11-26 (×2): qty 90, 90d supply, fill #0

## 2022-10-31 ENCOUNTER — Other Ambulatory Visit: Payer: Self-pay

## 2022-11-01 ENCOUNTER — Other Ambulatory Visit: Payer: Self-pay

## 2022-11-02 ENCOUNTER — Other Ambulatory Visit: Payer: Self-pay

## 2022-11-05 ENCOUNTER — Other Ambulatory Visit: Payer: Self-pay

## 2022-11-05 MED ORDER — FAMOTIDINE 20 MG PO TABS
ORAL_TABLET | ORAL | 0 refills | Status: DC
  Filled 2022-11-05: qty 30, 15d supply, fill #0

## 2022-11-05 MED ORDER — LOVASTATIN 20 MG PO TABS
ORAL_TABLET | ORAL | 0 refills | Status: DC
  Filled 2022-11-05: qty 30, 30d supply, fill #0

## 2022-11-16 ENCOUNTER — Other Ambulatory Visit: Payer: Self-pay | Admitting: Student

## 2022-11-16 ENCOUNTER — Other Ambulatory Visit: Payer: Self-pay

## 2022-11-16 DIAGNOSIS — Z1231 Encounter for screening mammogram for malignant neoplasm of breast: Secondary | ICD-10-CM

## 2022-11-16 MED ORDER — PNEUMOCOCCAL 20-VAL CONJ VACC 0.5 ML IM SUSY
PREFILLED_SYRINGE | INTRAMUSCULAR | 0 refills | Status: AC
  Filled 2022-11-16 – 2022-11-20 (×2): qty 0.5, 1d supply, fill #0

## 2022-11-16 MED ORDER — MOUNJARO 5 MG/0.5ML ~~LOC~~ SOAJ
5.0000 mg | SUBCUTANEOUS | 1 refills | Status: DC
  Filled 2022-11-16 – 2022-11-30 (×4): qty 2, 28d supply, fill #0
  Filled 2022-12-28: qty 2, 28d supply, fill #1
  Filled 2023-02-03: qty 2, 28d supply, fill #2
  Filled 2023-03-14: qty 2, 28d supply, fill #3
  Filled 2023-04-11: qty 2, 28d supply, fill #4
  Filled 2023-05-07: qty 2, 28d supply, fill #5

## 2022-11-16 MED ORDER — FREESTYLE LIBRE 2 SENSOR MISC
3 refills | Status: AC
  Filled 2022-11-16: qty 6, 84d supply, fill #0

## 2022-11-16 MED ORDER — PANTOPRAZOLE SODIUM 40 MG PO TBEC
DELAYED_RELEASE_TABLET | ORAL | 3 refills | Status: AC
  Filled 2022-11-16 (×2): qty 90, 90d supply, fill #0
  Filled 2023-02-22: qty 90, 90d supply, fill #1
  Filled 2023-07-30: qty 90, 90d supply, fill #2
  Filled 2023-11-11: qty 90, 90d supply, fill #3

## 2022-11-16 MED ORDER — LOVASTATIN 20 MG PO TABS
20.0000 mg | ORAL_TABLET | Freq: Every day | ORAL | 3 refills | Status: DC
  Filled 2022-11-16 – 2022-11-26 (×2): qty 90, 90d supply, fill #0
  Filled 2023-02-22: qty 90, 90d supply, fill #1
  Filled 2023-06-11: qty 90, 90d supply, fill #2
  Filled 2023-09-06: qty 90, 90d supply, fill #3

## 2022-11-19 ENCOUNTER — Other Ambulatory Visit: Payer: Self-pay

## 2022-11-20 ENCOUNTER — Other Ambulatory Visit: Payer: Self-pay

## 2022-11-21 ENCOUNTER — Other Ambulatory Visit: Payer: Self-pay | Admitting: Orthopedic Surgery

## 2022-11-21 DIAGNOSIS — M75111 Incomplete rotator cuff tear or rupture of right shoulder, not specified as traumatic: Secondary | ICD-10-CM

## 2022-11-23 ENCOUNTER — Other Ambulatory Visit: Payer: Self-pay

## 2022-11-26 ENCOUNTER — Other Ambulatory Visit: Payer: Self-pay

## 2022-11-28 ENCOUNTER — Other Ambulatory Visit: Payer: Self-pay

## 2022-11-28 ENCOUNTER — Ambulatory Visit
Admission: RE | Admit: 2022-11-28 | Discharge: 2022-11-28 | Disposition: A | Discharge status: Home / Self Care | Payer: BC Managed Care – PPO | Source: Ambulatory Visit | Attending: Orthopedic Surgery | Admitting: Orthopedic Surgery

## 2022-11-28 DIAGNOSIS — M75111 Incomplete rotator cuff tear or rupture of right shoulder, not specified as traumatic: Secondary | ICD-10-CM | POA: Insufficient documentation

## 2022-11-30 ENCOUNTER — Other Ambulatory Visit: Payer: Self-pay

## 2022-12-14 DIAGNOSIS — M7581 Other shoulder lesions, right shoulder: Secondary | ICD-10-CM | POA: Insufficient documentation

## 2022-12-14 DIAGNOSIS — M19011 Primary osteoarthritis, right shoulder: Secondary | ICD-10-CM | POA: Insufficient documentation

## 2022-12-14 DIAGNOSIS — M7521 Bicipital tendinitis, right shoulder: Secondary | ICD-10-CM | POA: Insufficient documentation

## 2022-12-17 ENCOUNTER — Other Ambulatory Visit: Payer: Self-pay

## 2022-12-17 MED ORDER — FAMOTIDINE 20 MG PO TABS
ORAL_TABLET | ORAL | 0 refills | Status: DC
  Filled 2022-12-17: qty 30, 15d supply, fill #0

## 2022-12-27 ENCOUNTER — Ambulatory Visit
Admission: RE | Admit: 2022-12-27 | Discharge: 2022-12-27 | Disposition: A | Discharge status: Home / Self Care | Payer: BC Managed Care – PPO | Source: Ambulatory Visit | Attending: Student | Admitting: Student

## 2022-12-27 DIAGNOSIS — Z1231 Encounter for screening mammogram for malignant neoplasm of breast: Secondary | ICD-10-CM | POA: Insufficient documentation

## 2022-12-28 ENCOUNTER — Other Ambulatory Visit: Payer: Self-pay | Admitting: Surgery

## 2023-01-04 ENCOUNTER — Other Ambulatory Visit: Payer: Self-pay

## 2023-01-04 MED ORDER — LURASIDONE HCL 80 MG PO TABS
80.0000 mg | ORAL_TABLET | Freq: Every day | ORAL | 0 refills | Status: DC
  Filled 2023-01-04 – 2023-02-22 (×2): qty 90, 90d supply, fill #0

## 2023-01-04 MED ORDER — HYDROXYZINE PAMOATE 50 MG PO CAPS
50.0000 mg | ORAL_CAPSULE | Freq: Every day | ORAL | 0 refills | Status: DC | PRN
  Filled 2023-01-04: qty 90, 90d supply, fill #0

## 2023-01-04 MED ORDER — TRAZODONE HCL 150 MG PO TABS
150.0000 mg | ORAL_TABLET | Freq: Every day | ORAL | 0 refills | Status: DC
  Filled 2023-01-04: qty 29, 29d supply, fill #0
  Filled 2023-01-04: qty 61, 61d supply, fill #0

## 2023-01-07 ENCOUNTER — Other Ambulatory Visit: Payer: Self-pay | Admitting: Student

## 2023-01-07 ENCOUNTER — Ambulatory Visit
Admission: RE | Admit: 2023-01-07 | Discharge: 2023-01-07 | Disposition: A | Discharge status: Home / Self Care | Payer: BC Managed Care – PPO | Source: Ambulatory Visit | Attending: Student | Admitting: Student

## 2023-01-07 DIAGNOSIS — N63 Unspecified lump in unspecified breast: Secondary | ICD-10-CM | POA: Diagnosis present

## 2023-01-07 DIAGNOSIS — R928 Other abnormal and inconclusive findings on diagnostic imaging of breast: Secondary | ICD-10-CM

## 2023-01-11 ENCOUNTER — Other Ambulatory Visit: Payer: Self-pay

## 2023-01-11 ENCOUNTER — Encounter
Admission: RE | Admit: 2023-01-11 | Discharge: 2023-01-11 | Disposition: A | Discharge status: Home / Self Care | Payer: BC Managed Care – PPO | Source: Ambulatory Visit | Attending: Surgery | Admitting: Surgery

## 2023-01-11 DIAGNOSIS — Z01812 Encounter for preprocedural laboratory examination: Secondary | ICD-10-CM

## 2023-01-11 HISTORY — DX: Personal history of urinary calculi: Z87.442

## 2023-01-11 HISTORY — DX: Pneumonia, unspecified organism: J18.9

## 2023-01-11 NOTE — Patient Instructions (Addendum)
Your procedure is scheduled on: 01/24/23 - Thursday Report to the Registration Desk on the 1st floor of the [facility]. To find out your arrival time, please call [phone] between 1PM - 3PM on: 01/23/23 - Wednesday If your arrival time is 6:00 am, do not arrive prior to that time as the [facility] entrance doors do not open until 6:00 am.  REMEMBER: Instructions that are not followed completely may result in serious medical risk, up to and including death; or upon the discretion of your surgeon and anesthesiologist your surgery may need to be rescheduled.  Do not eat food after midnight the night before surgery.  No gum chewing, lozengers or hard candies.  You may however, drink CLEAR liquids up to 2 hours before you are scheduled to arrive for your surgery. Do not drink anything within 2 hours of your scheduled arrival time.  Clear liquids include: - water   Type 1 and Type 2 diabetics should only drink water.  In addition, your doctor has ordered for you to drink the provided  Gatorade G2 Drinking this carbohydrate drink up to two hours before surgery helps to reduce insulin resistance and improve patient outcomes. Please complete drinking 2 hours prior to scheduled arrival time.  TAKE THESE MEDICATIONS THE MORNING OF SURGERY WITH A SIP OF WATER:  - famotidine (PEPCID)    HOLD tirzepatide (MOUNJARO) 7 days prior to your surgery.  One week prior to surgery: Stop Anti-inflammatories (NSAIDS) such as Advil, Aleve, Ibuprofen, Motrin, Naproxen, Naprosyn and Aspirin based products such as Excedrin, Goodys Powder, BC Powder.  Stop beginning 1 week before your surgery  ANY OVER THE COUNTER supplements until after surgery : BIOTIN P ,CALCIUM-MAGNESIUM-VITAMIN D .  You may take Tylenol if needed for pain up until the day of surgery.  No Alcohol for 24 hours before or after surgery.  No Smoking including e-cigarettes for 24 hours prior to surgery.  No chewable tobacco products  for at least 6 hours prior to surgery.  No nicotine patches on the day of surgery.  Do not use any "recreational" drugs for at least a week prior to your surgery.  Please be advised that the combination of cocaine and anesthesia may have negative outcomes, up to and including death. If you test positive for cocaine, your surgery will be cancelled.  On the morning of surgery brush your teeth with toothpaste and water, you may rinse your mouth with mouthwash if you wish. Do not swallow any toothpaste or mouthwash.  Use CHG Soap or wipes as directed on instruction sheet.  Do not wear jewelry, make-up, hairpins, clips or nail polish.  Do not wear lotions, powders, or perfumes.   Do not shave body from the neck down 48 hours prior to surgery just in case you cut yourself which could leave a site for infection.  Also, freshly shaved skin may become irritated if using the CHG soap.  Contact lenses, hearing aids and dentures may not be worn into surgery.  Do not bring valuables to the hospital. [facility] is not responsible for any missing/lost belongings or valuables.   Notify your doctor if there is any change in your medical condition (cold, fever, infection).  Wear comfortable clothing (specific to your surgery type) to the hospital.  After surgery, you can help prevent lung complications by doing breathing exercises.  Take deep breaths and cough every 1-2 hours. Your doctor may order a device called an Incentive Spirometer to help you take deep breaths.  When coughing or sneezing, hold a pillow firmly against your incision with both hands. This is called "splinting." Doing this helps protect your incision. It also decreases belly discomfort.  If you are being admitted to the hospital overnight, leave your suitcase in the car. After surgery it may be brought to your room.  If you are being discharged the day of surgery, you will not be allowed to drive home. You will need a responsible  adult (18 years or older) to drive you home and stay with you that night.   If you are taking public transportation, you will need to have a responsible adult (18 years or older) with you. Please confirm with your physician that it is acceptable to use public transportation.   Please call the [facility] at [phone] if you have any questions about these instructions.  Surgery Visitation Policy:  Patients undergoing a surgery or procedure may have two family members or support persons with them as long as the person is not COVID-19 positive or experiencing its symptoms.   Inpatient Visitation:    Visiting hours are 7 a.m. to 8 p.m. Up to four visitors are allowed at one time in a patient room. The visitors may rotate out with other people during the day. One designated support person (adult) may remain overnight.  Due to an increase in RSV and influenza rates and associated hospitalizations, children ages 80 and under will not be able to visit patients in [facility]. Masks continue to be strongly recommended.

## 2023-01-13 ENCOUNTER — Other Ambulatory Visit: Payer: Self-pay

## 2023-01-14 ENCOUNTER — Other Ambulatory Visit: Payer: Self-pay

## 2023-01-14 NOTE — Progress Notes (Signed)
  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 In review of her medication reconciliation it was noted that patient is on a prescribed GLP-1 medication. Per guidelines issued by the American Society of Anesthesiologists (ASA), it is recommended that these medications be held for 7 days prior to the patient undergoing any type of elective surgical procedure. The patient is taking the following GIP/GLP-1 medication:  '[]'$  SEMAGLUTIDE   '[]'$  EXENATIDE  '[]'$  LIRAGLUTIDE   '[]'$  LIXISENATIDE  '[]'$  DULAGLUTIDE     '[x]'$  OTHER GIP/GLP-1 medication: tirzepatide  Reached out to prescribing provider Eulas Post, PA-C) to make them aware of the guidelines from anesthesia. Given that this patient takes the prescribed GLP-1 medication for her  diabetes diagnosis, rather than for weight loss, recommendations from the prescribing provider were solicited. Prescribing provider made aware of the following so that informed decision/POC can be developed for this patient that may be taking medications belonging to these drug classes:  Oral GLP-1 medications will be held 1 day prior to surgery.  Injectable GLP-1 medications will be held 7 days prior to surgery.  Metformin is  routinely held 48 hours prior to surgery due to renal concerns, potential need for contrasted imaging perioperatively, and the potential for tissue hypoxia leading to drug induced lactic acidosis.  All SGLT2i medications are held 72 hours prior to surgery as they can be associated with the increased potential for developing euglycemic diabetic ketoacidosis (EDKA).   Impression and Plan:  Monica Dominguez is on a prescribed GLP-1 medication, which induces the known side effect of decreased gastric emptying. Efforts are bring made to mitigate the risk of perioperative hyperglycemic events, as elevated blood glucose levels have been found to contribute to intra/postoperative complications. Additionally, hyperglycemic extremes can potentially necessitate the postponing of a patient's elective case in order to better optimize perioperative glycemic control, again with the aforementioned guidelines in place. With this in mind, recommendations have been sought from the prescribing provider, who has cleared patient to proceed with holding the prescribed GIP/GLP-1 as per the guidelines from the ASA.   Provider recommending: no further recommendations received from the prescribing provider.  Copy of signed clearance and recommendations placed on patient's chart for inclusion in their medical record and for review by the surgical/anesthetic team on the day of her procedure.   Honor Loh, MSN, APRN, FNP-C, CEN [facility]  Peri-operative Services Nurse Practitioner Phone: [phone] 01/14/23 1:44 PM  NOTE: This note has been prepared using Dragon dictation software. Despite my best ability to proofread, there is always the potential that unintentional transcriptional errors may still occur from this process.

## 2023-01-15 ENCOUNTER — Other Ambulatory Visit: Payer: Self-pay

## 2023-01-16 ENCOUNTER — Other Ambulatory Visit (HOSPITAL_COMMUNITY): Payer: Self-pay

## 2023-01-16 ENCOUNTER — Other Ambulatory Visit: Payer: Self-pay

## 2023-01-16 ENCOUNTER — Encounter
Admission: RE | Admit: 2023-01-16 | Discharge: 2023-01-16 | Disposition: A | Discharge status: Home / Self Care | Payer: BC Managed Care – PPO | Source: Ambulatory Visit | Attending: Surgery | Admitting: Surgery

## 2023-01-16 DIAGNOSIS — Z01812 Encounter for preprocedural laboratory examination: Secondary | ICD-10-CM

## 2023-01-16 DIAGNOSIS — Z01810 Encounter for preprocedural cardiovascular examination: Secondary | ICD-10-CM | POA: Diagnosis not present

## 2023-01-16 MED ORDER — FAMOTIDINE 20 MG PO TABS
20.0000 mg | ORAL_TABLET | Freq: Two times a day (BID) | ORAL | 0 refills | Status: DC | PRN
  Filled 2023-01-16: qty 30, 15d supply, fill #0

## 2023-01-24 ENCOUNTER — Ambulatory Visit: Payer: BC Managed Care – PPO | Admitting: Urgent Care

## 2023-01-24 ENCOUNTER — Other Ambulatory Visit: Payer: Self-pay

## 2023-01-24 ENCOUNTER — Ambulatory Visit: Payer: BC Managed Care – PPO

## 2023-01-24 ENCOUNTER — Encounter: Payer: Self-pay | Admitting: Surgery

## 2023-01-24 ENCOUNTER — Ambulatory Visit
Admission: RE | Admit: 2023-01-24 | Discharge: 2023-01-24 | Disposition: A | Discharge status: Home / Self Care | Payer: BC Managed Care – PPO | Attending: Surgery | Admitting: Surgery

## 2023-01-24 ENCOUNTER — Encounter
Admission: RE | Disposition: A | Discharge status: Home / Self Care | Payer: Self-pay | Source: Home / Self Care | Attending: Surgery

## 2023-01-24 DIAGNOSIS — Z7985 Long-term (current) use of injectable non-insulin antidiabetic drugs: Secondary | ICD-10-CM | POA: Diagnosis not present

## 2023-01-24 DIAGNOSIS — M24111 Other articular cartilage disorders, right shoulder: Secondary | ICD-10-CM | POA: Insufficient documentation

## 2023-01-24 DIAGNOSIS — M19011 Primary osteoarthritis, right shoulder: Secondary | ICD-10-CM | POA: Diagnosis not present

## 2023-01-24 DIAGNOSIS — M25811 Other specified joint disorders, right shoulder: Secondary | ICD-10-CM | POA: Diagnosis present

## 2023-01-24 DIAGNOSIS — M7581 Other shoulder lesions, right shoulder: Secondary | ICD-10-CM | POA: Diagnosis not present

## 2023-01-24 DIAGNOSIS — M7521 Bicipital tendinitis, right shoulder: Secondary | ICD-10-CM | POA: Insufficient documentation

## 2023-01-24 DIAGNOSIS — E119 Type 2 diabetes mellitus without complications: Secondary | ICD-10-CM | POA: Diagnosis not present

## 2023-01-24 HISTORY — PX: SHOULDER ARTHROSCOPY WITH SUBACROMIAL DECOMPRESSION, ROTATOR CUFF REPAIR AND BICEP TENDON REPAIR: SHX5687

## 2023-01-24 LAB — GLUCOSE, CAPILLARY
Glucose-Capillary: 109 mg/dL — ABNORMAL HIGH (ref 70–99)
Glucose-Capillary: 98 mg/dL (ref 70–99)

## 2023-01-24 SURGERY — SHOULDER ARTHROSCOPY WITH SUBACROMIAL DECOMPRESSION, ROTATOR CUFF REPAIR AND BICEP TENDON REPAIR
Anesthesia: General | Site: Shoulder | Laterality: Right

## 2023-01-24 MED ORDER — BUPIVACAINE-EPINEPHRINE 0.5% -1:200000 IJ SOLN
INTRAMUSCULAR | Status: DC | PRN
  Administered 2023-01-24: 30 mL

## 2023-01-24 MED ORDER — KETOROLAC TROMETHAMINE 30 MG/ML IJ SOLN
INTRAMUSCULAR | Status: AC
  Administered 2023-01-24: 30 mg via INTRAVENOUS
  Filled 2023-01-24: qty 1

## 2023-01-24 MED ORDER — LIDOCAINE HCL (CARDIAC) PF 100 MG/5ML IV SOSY
PREFILLED_SYRINGE | INTRAVENOUS | Status: DC | PRN
  Administered 2023-01-24: 100 mg via INTRAVENOUS

## 2023-01-24 MED ORDER — CEFAZOLIN SODIUM-DEXTROSE 2-4 GM/100ML-% IV SOLN
2.0000 g | Freq: Once | INTRAVENOUS | Status: AC
  Administered 2023-01-24: 2 g via INTRAVENOUS

## 2023-01-24 MED ORDER — FENTANYL CITRATE PF 50 MCG/ML IJ SOSY
PREFILLED_SYRINGE | INTRAMUSCULAR | Status: AC
  Administered 2023-01-24: 50 ug via INTRAVENOUS
  Filled 2023-01-24: qty 1

## 2023-01-24 MED ORDER — DEXAMETHASONE SODIUM PHOSPHATE 10 MG/ML IJ SOLN
INTRAMUSCULAR | Status: DC | PRN
  Administered 2023-01-24: 10 mg via INTRAVENOUS

## 2023-01-24 MED ORDER — CHLORHEXIDINE GLUCONATE 0.12 % MT SOLN: 15.0000 mL | Freq: Once | OROMUCOSAL | Status: AC

## 2023-01-24 MED ORDER — KETOROLAC TROMETHAMINE 30 MG/ML IJ SOLN: 30.0000 mg | Freq: Once | INTRAMUSCULAR | Status: AC

## 2023-01-24 MED ORDER — ACETAMINOPHEN 325 MG PO TABS: 325.0000 mg | ORAL_TABLET | ORAL | Status: DC | PRN

## 2023-01-24 MED ORDER — BUPIVACAINE LIPOSOME 1.3 % IJ SUSP
INTRAMUSCULAR | Status: DC | PRN
  Administered 2023-01-24: 15 mL via PERINEURAL

## 2023-01-24 MED ORDER — MIDAZOLAM HCL 2 MG/2ML IJ SOLN
INTRAMUSCULAR | Status: AC
  Administered 2023-01-24: 1 mg via INTRAVENOUS
  Filled 2023-01-24: qty 2

## 2023-01-24 MED ORDER — DROPERIDOL 2.5 MG/ML IJ SOLN: 0.6250 mg | Freq: Once | INTRAMUSCULAR | Status: DC | PRN

## 2023-01-24 MED ORDER — ROCURONIUM BROMIDE 100 MG/10ML IV SOLN
INTRAVENOUS | Status: DC | PRN
  Administered 2023-01-24: 50 mg via INTRAVENOUS

## 2023-01-24 MED ORDER — FENTANYL CITRATE (PF) 100 MCG/2ML IJ SOLN
INTRAMUSCULAR | Status: AC
  Filled 2023-01-24: qty 2

## 2023-01-24 MED ORDER — MIDAZOLAM HCL 2 MG/2ML IJ SOLN
INTRAMUSCULAR | Status: AC
  Filled 2023-01-24: qty 2

## 2023-01-24 MED ORDER — FENTANYL CITRATE (PF) 100 MCG/2ML IJ SOLN
INTRAMUSCULAR | Status: DC | PRN
  Administered 2023-01-24: 100 ug via INTRAVENOUS

## 2023-01-24 MED ORDER — ORAL CARE MOUTH RINSE: 15.0000 mL | Freq: Once | OROMUCOSAL | Status: AC

## 2023-01-24 MED ORDER — ONDANSETRON HCL 4 MG/2ML IJ SOLN: 4.0000 mg | Freq: Once | INTRAMUSCULAR | Status: DC | PRN

## 2023-01-24 MED ORDER — ACETAMINOPHEN 160 MG/5ML PO SOLN: 325.0000 mg | ORAL | Status: DC | PRN

## 2023-01-24 MED ORDER — SUGAMMADEX SODIUM 200 MG/2ML IV SOLN
INTRAVENOUS | Status: DC | PRN
  Administered 2023-01-24: 200 mg via INTRAVENOUS

## 2023-01-24 MED ORDER — SODIUM CHLORIDE 0.9 % IR SOLN
Status: DC | PRN
  Administered 2023-01-24 (×2): 3001 mL

## 2023-01-24 MED ORDER — CEFAZOLIN SODIUM-DEXTROSE 2-4 GM/100ML-% IV SOLN
INTRAVENOUS | Status: AC
  Filled 2023-01-24: qty 100

## 2023-01-24 MED ORDER — OXYCODONE HCL 5 MG PO TABS
5.0000 mg | ORAL_TABLET | ORAL | 0 refills | Status: DC | PRN
  Filled 2023-01-24: qty 40, 7d supply, fill #0

## 2023-01-24 MED ORDER — FENTANYL CITRATE (PF) 100 MCG/2ML IJ SOLN: 25.0000 ug | INTRAMUSCULAR | Status: DC | PRN

## 2023-01-24 MED ORDER — PROPOFOL 10 MG/ML IV BOLUS
INTRAVENOUS | Status: DC | PRN
  Administered 2023-01-24: 200 mg via INTRAVENOUS
  Administered 2023-01-24: 20 mg via INTRAVENOUS

## 2023-01-24 MED ORDER — HYDROCODONE-ACETAMINOPHEN 7.5-325 MG PO TABS: 1.0000 | ORAL_TABLET | Freq: Once | ORAL | Status: DC | PRN

## 2023-01-24 MED ORDER — LACTATED RINGERS IV SOLN: INTRAVENOUS | Status: DC | PRN

## 2023-01-24 MED ORDER — BUPIVACAINE LIPOSOME 1.3 % IJ SUSP
INTRAMUSCULAR | Status: AC
  Filled 2023-01-24: qty 20

## 2023-01-24 MED ORDER — ONDANSETRON HCL 4 MG/2ML IJ SOLN
INTRAMUSCULAR | Status: DC | PRN
  Administered 2023-01-24: 4 mg via INTRAVENOUS

## 2023-01-24 MED ORDER — FENTANYL CITRATE PF 50 MCG/ML IJ SOSY: 50.0000 ug | PREFILLED_SYRINGE | Freq: Once | INTRAMUSCULAR | Status: AC

## 2023-01-24 MED ORDER — OXYCODONE HCL 5 MG PO TABS: 5.0000 mg | ORAL_TABLET | ORAL | 0 refills | Status: DC | PRN

## 2023-01-24 MED ORDER — CHLORHEXIDINE GLUCONATE 0.12 % MT SOLN
OROMUCOSAL | Status: AC
  Administered 2023-01-24: 15 mL via OROMUCOSAL
  Filled 2023-01-24: qty 15

## 2023-01-24 MED ORDER — PHENYLEPHRINE HCL (PRESSORS) 10 MG/ML IV SOLN
INTRAVENOUS | Status: DC | PRN
  Administered 2023-01-24: 160 ug via INTRAVENOUS
  Administered 2023-01-24 (×3): 240 ug via INTRAVENOUS

## 2023-01-24 MED ORDER — GLYCOPYRROLATE 0.2 MG/ML IJ SOLN
INTRAMUSCULAR | Status: DC | PRN
  Administered 2023-01-24: .2 mg via INTRAVENOUS

## 2023-01-24 MED ORDER — MIDAZOLAM HCL 2 MG/2ML IJ SOLN
INTRAMUSCULAR | Status: DC | PRN
  Administered 2023-01-24: 2 mg via INTRAVENOUS

## 2023-01-24 MED ORDER — BUPIVACAINE-EPINEPHRINE (PF) 0.5% -1:200000 IJ SOLN
INTRAMUSCULAR | Status: AC
  Filled 2023-01-24: qty 30

## 2023-01-24 MED ORDER — MIDAZOLAM HCL 2 MG/2ML IJ SOLN
1.0000 mg | INTRAMUSCULAR | Status: AC | PRN
  Administered 2023-01-24: 1 mg via INTRAVENOUS

## 2023-01-24 MED ORDER — LACTATED RINGERS IV SOLN: INTRAVENOUS | Status: DC

## 2023-01-24 MED ORDER — EPINEPHRINE PF 1 MG/ML IJ SOLN
INTRAMUSCULAR | Status: AC
  Filled 2023-01-24: qty 2

## 2023-01-24 MED ORDER — EPHEDRINE SULFATE (PRESSORS) 50 MG/ML IJ SOLN
INTRAMUSCULAR | Status: DC | PRN
  Administered 2023-01-24: 5 mg via INTRAVENOUS
  Administered 2023-01-24: 10 mg via INTRAVENOUS
  Administered 2023-01-24: 2.5 mg via INTRAVENOUS
  Administered 2023-01-24: 10 mg via INTRAVENOUS

## 2023-01-24 MED ORDER — SEVOFLURANE IN SOLN
RESPIRATORY_TRACT | Status: AC
  Filled 2023-01-24: qty 250

## 2023-01-24 MED ORDER — PHENYLEPHRINE HCL-NACL 20-0.9 MG/250ML-% IV SOLN
INTRAVENOUS | Status: DC | PRN
  Administered 2023-01-24: 50 ug/min via INTRAVENOUS

## 2023-01-24 MED ORDER — BUPIVACAINE HCL (PF) 0.5 % IJ SOLN
INTRAMUSCULAR | Status: DC | PRN
  Administered 2023-01-24: 5 mL via PERINEURAL

## 2023-01-24 MED ORDER — BUPIVACAINE HCL (PF) 0.5 % IJ SOLN
INTRAMUSCULAR | Status: AC
  Filled 2023-01-24: qty 10

## 2023-01-24 SURGICAL SUPPLY — 49 items
BIT DRILL JUGRKNT W/NDL BIT2.9 (DRILL) IMPLANT
BLADE FULL RADIUS 3.5 (BLADE) ×1 IMPLANT
BUR ACROMIONIZER 4.0 (BURR) ×1 IMPLANT
CANNULA SHAVER 8MMX76MM (CANNULA) ×1 IMPLANT
CHLORAPREP W/TINT 26 (MISCELLANEOUS) ×1 IMPLANT
COVER MAYO STAND REUSABLE (DRAPES) ×1 IMPLANT
DILATOR 5.5 THREADED HEALICOIL (MISCELLANEOUS) IMPLANT
DRILL JUGGERKNOT W/NDL BIT 2.9 (DRILL)
DRSG OPSITE POSTOP 3X4 (GAUZE/BANDAGES/DRESSINGS) IMPLANT
DRSG XEROFORM 1X8 (GAUZE/BANDAGES/DRESSINGS) IMPLANT
ELECT CAUTERY BLADE 6.4 (BLADE) ×1 IMPLANT
ELECT REM PT RETURN 9FT ADLT (ELECTROSURGICAL) ×1
ELECTRODE REM PT RTRN 9FT ADLT (ELECTROSURGICAL) ×1 IMPLANT
GAUZE SPONGE 4X4 12PLY STRL (GAUZE/BANDAGES/DRESSINGS) ×1 IMPLANT
GAUZE XEROFORM 1X8 LF (GAUZE/BANDAGES/DRESSINGS) ×1 IMPLANT
GLOVE BIO SURGEON STRL SZ7.5 (GLOVE) ×2 IMPLANT
GLOVE BIO SURGEON STRL SZ8 (GLOVE) ×2 IMPLANT
GLOVE BIOGEL PI IND STRL 8 (GLOVE) ×1 IMPLANT
GLOVE SURG UNDER LTX SZ8 (GLOVE) ×1 IMPLANT
GOWN STRL REUS W/ TWL LRG LVL3 (GOWN DISPOSABLE) ×1 IMPLANT
GOWN STRL REUS W/ TWL XL LVL3 (GOWN DISPOSABLE) ×1 IMPLANT
GOWN STRL REUS W/TWL LRG LVL3 (GOWN DISPOSABLE) ×1
GOWN STRL REUS W/TWL XL LVL3 (GOWN DISPOSABLE) ×1
GRASPER SUT 15 45D LOW PRO (SUTURE) IMPLANT
IV LACTATED RINGER IRRG 3000ML (IV SOLUTION) ×2
IV LR IRRIG 3000ML ARTHROMATIC (IV SOLUTION) ×2 IMPLANT
KIT CANNULA 8X76-LX IN CANNULA (CANNULA) ×1 IMPLANT
MANIFOLD NEPTUNE II (INSTRUMENTS) ×2 IMPLANT
MASK FACE SPIDER DISP (MASK) ×1 IMPLANT
MAT ABSORB  FLUID 56X50 GRAY (MISCELLANEOUS) ×1
MAT ABSORB FLUID 56X50 GRAY (MISCELLANEOUS) ×1 IMPLANT
PACK ARTHROSCOPY SHOULDER (MISCELLANEOUS) ×1 IMPLANT
PAD ABD DERMACEA PRESS 5X9 (GAUZE/BANDAGES/DRESSINGS) ×2 IMPLANT
PASSER SUT FIRSTPASS SELF (INSTRUMENTS) IMPLANT
SLEEVE REMOTE CONTROL 5X12 (DRAPES) IMPLANT
SLING ARM LRG DEEP (SOFTGOODS) ×1 IMPLANT
SPONGE T-LAP 18X18 ~~LOC~~+RFID (SPONGE) ×1 IMPLANT
STAPLER SKIN PROX 35W (STAPLE) ×1 IMPLANT
STRAP SAFETY 5IN WIDE (MISCELLANEOUS) ×1 IMPLANT
SUT ETHIBOND 0 MO6 C/R (SUTURE) ×1 IMPLANT
SUT ULTRABRAID 2 COBRAID 38 (SUTURE) IMPLANT
SUT VIC AB 2-0 CT1 27 (SUTURE) ×2
SUT VIC AB 2-0 CT1 TAPERPNT 27 (SUTURE) ×2 IMPLANT
TAPE MICROFOAM 4IN (TAPE) ×1 IMPLANT
TRAP FLUID SMOKE EVACUATOR (MISCELLANEOUS) ×1 IMPLANT
TUBING CONNECTING 10 (TUBING) ×1 IMPLANT
TUBING INFLOW SET DBFLO PUMP (TUBING) ×1 IMPLANT
WAND WEREWOLF FLOW 90D (MISCELLANEOUS) ×1 IMPLANT
WATER STERILE IRR 500ML POUR (IV SOLUTION) ×1 IMPLANT

## 2023-01-24 NOTE — Anesthesia Postprocedure Evaluation (Signed)
Anesthesia Post Note  Patient: Monica Dominguez  Procedure(s) Performed: SHOULDER ARTHROSCOPY WITH DEBRIDEMENT, DECOMPRESSION, EXCISION OF DISTAL CLAVICLE (Right: Shoulder)  Patient location during evaluation: PACU Anesthesia Type: General Level of consciousness: awake and alert Pain management: pain level controlled Vital Signs Assessment: post-procedure vital signs reviewed and stable Respiratory status: spontaneous breathing, nonlabored ventilation and respiratory function stable Cardiovascular status: blood pressure returned to baseline and stable Postop Assessment: no apparent nausea or vomiting Anesthetic complications: no   No notable events documented.   Last Vitals:  Vitals:   01/24/23 1435 01/24/23 1440  BP:  130/78  Pulse: 100 97  Resp: (!) 26 (!) 22  Temp:    SpO2: 96% 96%    Last Pain:  Vitals:   01/24/23 1430  TempSrc:   PainSc: 0-No pain                 Alphonsus Sias

## 2023-01-24 NOTE — Anesthesia Preprocedure Evaluation (Addendum)
Anesthesia Evaluation  Patient identified by MRN, date of birth, ID band Patient awake    Reviewed: Allergy & Precautions, NPO status , Patient's Chart, lab work & pertinent test results  Airway Mallampati: IV  TM Distance: >3 FB Neck ROM: full    Dental  (+) Chipped, Missing   Pulmonary sleep apnea , pneumonia, resolved, Current Smoker and Patient abstained from smoking.   Pulmonary exam normal        Cardiovascular negative cardio ROS Normal cardiovascular exam  Incomplt RBBB on EKG, no sig change from prior   Neuro/Psych  PSYCHIATRIC DISORDERS Anxiety  Bipolar Disorder    Neuromuscular disease    GI/Hepatic Neg liver ROS,GERD  Medicated and Controlled,,  Endo/Other  diabetes  Morbid obesityOff GLP-1 for 8 days  Renal/GU      Musculoskeletal  (+) Arthritis ,    Abdominal   Peds  Hematology negative hematology ROS (+)   Anesthesia Other Findings Past Medical History: No date: Anxiety No date: Arthritis No date: Bipolar 2 disorder (HCC) No date: Bipolar disorder (HCC) No date: Cervical cancer (HCC) No date: CTS (carpal tunnel syndrome) No date: CTS (carpal tunnel syndrome) No date: Diabetes mellitus without complication (HCC) No date: Environmental allergies No date: GERD (gastroesophageal reflux disease) No date: History of kidney stones No date: Hyperlipemia No date: Obesity No date: Pneumonia No date: Sleep apnea     Comment:  cpap  Past Surgical History: No date: ABDOMINAL HYSTERECTOMY 04/03/2013: CARPAL TUNNEL RELEASE; Right 11/18/2017: COLONOSCOPY WITH PROPOFOL; N/A     Comment:  Procedure: COLONOSCOPY WITH PROPOFOL;  Surgeon: Manya Silvas, MD;  Location: [facility] ENDOSCOPY;  Service:               Endoscopy;  Laterality: N/A; No date: DILATION AND CURETTAGE OF UTERUS 07/13/2016: ESOPHAGOGASTRODUODENOSCOPY (EGD) WITH PROPOFOL; N/A     Comment:  Procedure: ESOPHAGOGASTRODUODENOSCOPY  (EGD) WITH               PROPOFOL;  Surgeon: Manya Silvas, MD;  Location: [facility]              ENDOSCOPY;  Service: Endoscopy;  Laterality: N/A; 11/09/2016: HEMORRHOID SURGERY; N/A     Comment:  Procedure: HEMORRHOIDECTOMY;  Surgeon: Leonie Green, MD;  Location: [facility] ORS;  Service: General;                Laterality: N/A; No date: TONSILLECTOMY No date: WISDOM TOOTH EXTRACTION  BMI    Body Mass Index: 41.70 kg/m      Reproductive/Obstetrics                             Anesthesia Physical Anesthesia Plan  ASA: 3  Anesthesia Plan: General ETT   Post-op Pain Management: Regional block*   Induction: Intravenous  PONV Risk Score and Plan: Ondansetron, Dexamethasone, Midazolam and Treatment may vary due to age or medical condition  Airway Management Planned: Oral ETT  Additional Equipment:   Intra-op Plan:   Post-operative Plan: Extubation in OR  Informed Consent: I have reviewed the patients History and Physical, chart, labs and discussed the procedure including the risks, benefits and alternatives for the proposed anesthesia with the patient or authorized representative who has indicated his/her understanding and acceptance.     Dental Advisory Given  Plan Discussed with: Anesthesiologist, CRNA and Surgeon  Anesthesia Plan Comments: (Patient consented for risks of anesthesia including but not limited to:  - adverse reactions to medications - damage to eyes, teeth, lips or other oral mucosa - nerve damage due to positioning  - sore throat or hoarseness - Damage to heart, brain, nerves, lungs, other parts of body or loss of life  Patient voiced understanding.)       Anesthesia Quick Evaluation

## 2023-01-24 NOTE — Transfer of Care (Signed)
Immediate Anesthesia Transfer of Care Note  Patient: Monica Dominguez  Procedure(s) Performed: SHOULDER ARTHROSCOPY WITH DEBRIDEMENT, DECOMPRESSION, EXCISION OF DISTAL CLAVICLE (Right: Shoulder)  Patient Location: PACU  Anesthesia Type:General  Level of Consciousness: awake  Airway & Oxygen Therapy: Patient Spontanous Breathing and Patient connected to face mask  Post-op Assessment: Report given to RN and Post -op Vital signs reviewed and stable  Post vital signs: Reviewed  Last Vitals:  Vitals Value Taken Time  BP 137/85 01/24/23 1420  Temp 3F   Pulse 81 01/24/23 1422  Resp 25 01/24/23 1422  SpO2 100 % 01/24/23 1422  Vitals shown include unvalidated device data.  Last Pain:  Vitals:   01/24/23 1149  TempSrc:   PainSc: 0-No pain         Complications: No notable events documented.

## 2023-01-24 NOTE — Anesthesia Procedure Notes (Signed)
Anesthesia Regional Block: Interscalene brachial plexus block   Pre-Anesthetic Checklist: , timeout performed,  Correct Patient, Correct Site, Correct Laterality,  Correct Procedure, Correct Position, site marked,  Risks and benefits discussed,  Surgical consent,  Pre-op evaluation,  At surgeon's request and post-op pain management  Laterality: Upper and Right  Prep: chloraprep       Needles:  Injection technique: Single-shot  Needle Type: Echogenic Stimulator Needle     Needle Length: 10cm  Needle Gauge: 20   Needle insertion depth: 6 cm   Additional Needles:   Procedures: Doppler guided,,,, ultrasound used (permanent image in chart),,   Motor weakness within 18 minutes.  Narrative:  Start time: 01/24/2023 11:48 AM End time: 01/24/2023 11:52 AM Injection made incrementally with aspirations every 5 mL.  Performed by: Personally  Anesthesiologist: Alphonsus Sias, MD  Additional Notes: Functioning IV was confirmed and O2 [place]/monitors were applied. Light sedation administered as required, patient responsive throughout. A 154m 20ga EchoStim needle was used. Sterile prep and drape,hand hygiene and sterile gloves were used.  Negative aspiration and negative test dose prior to incremental administration of local anesthetic. 1% Lidocaine for skin wheal, 3 ml. Total LA: 246m- Exparel 155m 0.5% Bupivicaine 5ml11m/S images stored in chart. The patient tolerated the procedure well.

## 2023-01-24 NOTE — Anesthesia Procedure Notes (Signed)
Procedure Name: Intubation Date/Time: 01/24/2023 12:49 PM  Performed by: Otho Perl, CRNAPre-anesthesia Checklist: Patient identified, Patient being monitored, Timeout performed, Emergency Drugs available and Suction available Patient Re-evaluated:Patient Re-evaluated prior to induction Oxygen Delivery Method: Circle system utilized Preoxygenation: Pre-oxygenation with 100% oxygen Induction Type: IV induction Ventilation: Mask ventilation without difficulty Laryngoscope Size: Mac, 3 and McGraph Grade View: Grade I Tube type: Oral Tube size: 7.0 mm Number of attempts: 1 Airway Equipment and Method: Stylet Placement Confirmation: ETT inserted through vocal cords under direct vision, positive ETCO2 and breath sounds checked- equal and bilateral Secured at: 21 cm Tube secured with: Tape Dental Injury: Teeth and Oropharynx as per pre-operative assessment

## 2023-01-24 NOTE — Discharge Instructions (Addendum)
AMBULATORY SURGERY  DISCHARGE INSTRUCTIONS   The drugs that you were given will stay in your system until tomorrow so for the next 24 hours you should not:  Drive an automobile Make any legal decisions Drink any alcoholic beverage   You may resume regular meals tomorrow.  Today it is better to start with liquids and gradually work up to solid foods.  You may eat anything you prefer, but it is better to start with liquids, then soup and crackers, and gradually work up to solid foods.   Please notify your doctor immediately if you have any unusual bleeding, trouble breathing, redness and pain at the surgery site, drainage, fever, or pain not relieved by medication.    Your post-operative visit with Dr.                                       is: Date:                        Time:    Please call to schedule your post-operative visit.  Additional Instructions:   Orthopedic discharge instructions: Keep dressing dry and intact.  May shower after dressing changed and nerve block has worn off (on Monday, post-op day #4).  Cover staples with Band-Aids after drying off. Apply ice frequently to shoulder. Take OTC naproxen 2 tabs BID with meals for 3-5 days, then as necessary. Take oxycodone as prescribed when needed.  May supplement with ES Tylenol if necessary. Keep shoulder immobilizer on at all times except may remove for bathing purposes. Follow-up in 10-14 days or as scheduled.

## 2023-01-24 NOTE — H&P (Signed)
History of Present Illness: Monica Dominguez is a 47 y.o. female who presents today for her surgical history and physical for upcoming right shoulder arthroscopy with debridement, decompression, excision of distal clavicle and possible biceps tenodesis. Surgery scheduled with Dr. Roland Rack on 01/24/2023. The patient denies any changes in her medical history since she was last evaluated. She denies any recent falls or injury affecting the right shoulder since her last appointment. Pain score today is a 5 out of 10 to the right shoulder. The patient denies any personal history of heart attack or stroke. She denies any history of asthma or COPD. The patient denies any history of blood clots. The patient is a diabetic, she is currently taking Mounjaro which she has been instructed to stop taking 1 week prior to her surgery. She denies any numbness or tingling to the right upper extremity at today's appointment. Her most recent A1c was 5.9.  Past Medical History: Allergic state  Anxiety 05/01/2015  Arthritis  Bipolar 2 disorder (CMS-HCC)  Colon polyp  CTS (carpal tunnel syndrome) 05/20/2014  DM (diabetes mellitus) (CMS-HCC) 05/20/2014  Extremity pain 05/20/2014  GERD (gastroesophageal reflux disease)  Glaucoma (increased eye pressure)  H/O hemorrhoids  History of chicken pox  Hyperlipidemia, mixed 04/03/2017  Obesity 05/20/2014  OSA (obstructive sleep apnea) 05/20/2014   Past Surgical History: HYSTERECTOMY 2005  TONSILLECTOMY 2008  EGD 10/23/2012 (Gastritis)  COLONOSCOPY 10/23/2012 (Adenomatous Polyp: CBF)  CARPAL TUNNEL RELEASE Right 04/03/2013 (Dr. Rudene Christians)  EGD 07/13/2016 (Gastritis: No repeat per RTE)  COLONOSCOPY 11/18/2017 (Adenomatous Polyp: CBF 10/2022)  HYSTERECTOMY VAGINAL  WISDOM TEETH   Past Family History:  Breast cancer Mother  Diabetes type II Mother  Depression Mother  Hyperlipidemia (Elevated cholesterol) Mother  High blood pressure (Hypertension) Mother  Kidney failure  Mother  Allergies Father  Diabetes type II Father  Hemochromatosis Father  No Known Problems Sister  Anxiety Daughter  No Known Problems Daughter  No Known Problems Son  No Known Problems Son  Breast cancer Maternal Grandmother  No Known Problems Maternal Grandfather  No Known Problems Paternal Grandmother  No Known Problems Paternal Grandfather   Medications: cetirizine (ZYRTEC) 10 MG tablet take 1 tablet daily as needed 1  chlorhexidine (PERIDEX) 0.12 % solution Swish and spit 15 mLs 2 (two) times daily  cholecalciferol (VITAMIN D3) 2,000 unit capsule Take by mouth  clonazePAM (KLONOPIN) 0.5 MG tablet Take 1 tablet by mouth once daily as needed. 1  cyclobenzaprine (FLEXERIL) 5 MG tablet Take 1 tablet (5 mg total) by mouth at bedtime as needed 30 tablet 0  famotidine (PEPCID) 20 MG tablet Take 1 tablet (20 mg total) by mouth 2 (two) times daily as needed for Heartburn 30 tablet 0  flash glucose scanning (FREESTYLE LIBRE 14 DAY) reader Use 1 Device as directed 1 each 1  flash glucose sensor (FREESTYLE LIBRE 2 SENSOR) kit Inject 1 kit subcutaneously every 14 (fourteen) days for glucose monitoring 6 kit 3  fluticasone propionate (FLONASE) 50 mcg/actuation nasal spray Place 2 sprays into one nostril  hydrOXYzine (VISTARIL) 50 MG capsule Take 50 mg by mouth once daily  latanoprost (XALATAN) 0.005 % ophthalmic solution INT 1 GTT INTO OU QHS 11  lovastatin (MEVACOR) 20 MG tablet Take 1 tablet (20 mg total) by mouth once daily 90 tablet 3  lurasidone (LATUDA) 80 mg Tab tablet Take 80 mg by mouth once daily  nystatin (MYCOSTATIN) 100,000 unit/gram cream Apply topically once daily as needed 30 g 2  pantoprazole (PROTONIX) 40 MG DR tablet  Take 1 tablet (40 mg total) by mouth once daily 90 tablet 3  tirzepatide (MOUNJARO) 2.5 mg/0.5 mL PnIj Inject 2.5 mg subcutaneously every 7 (seven) days  traZODone (DESYREL) 100 MG tablet Take 150 mg by mouth at bedtime as needed  montelukast (SINGULAIR) 10 mg  tablet Take 1 tablet (10 mg total) by mouth nightly 90 tablet 3   Allergies: Tramadol Hives  Latex Hives  Lamictal [Lamotrigine] Rash   Review of Systems:  A comprehensive 14 point ROS was performed, reviewed by me today, and the pertinent orthopaedic findings are documented in the HPI.  Physical Exam: BP 118/82  Ht 157.5 cm ('5\' 2"'$ )  Wt (!) 104.6 kg (230 lb 9.6 oz)  BMI 42.18 kg/m  General/Constitutional: The patient appears to be well-nourished, well-developed, and in no acute distress. Neuro/Psych: Normal mood and affect, oriented to person, place and time. Eyes: Non-icteric. Pupils are equal, round, and reactive to light, and exhibit synchronous movement. ENT: Unremarkable. Lymphatic: No palpable adenopathy. Respiratory: Lungs clear to auscultation, Normal chest excursion, No wheezes, and Non-labored breathing Cardiovascular: Regular rate and rhythm. No murmurs. and No edema, swelling or tenderness, except as noted in detailed exam. Integumentary: No impressive skin lesions present, except as noted in detailed exam. Musculoskeletal: Unremarkable, except as noted in detailed exam.  Right shoulder exam: SKIN: normal SWELLING: none WARMTH: none LYMPH NODES: no adenopathy palpable CREPITUS: none TENDERNESS: Moderately tender over AC joint as well as over anterolateral shoulder ROM (active):  Forward flexion: 100 degrees Abduction: 90 degrees Internal rotation: Right buttock ROM (passive):  Forward flexion: 125 degrees Abduction: 110 degrees  ER/IR at 90 abd: 70 degrees / 50 degrees  She describes moderate to severe pain with all motions  STRENGTH: Forward flexion: 4/5 Abduction: 4/5 External rotation: 4-4+/5 Internal rotation: 4+/5 Pain with RC testing: Moderate-severe pain with resisted forward flexion and abduction  STABILITY: Normal  SPECIAL TESTS: Luan Pulling' test: positive, moderate Speed's test: positive Capsulitis - pain w/ passive ER: no Crossed arm test:  Moderately positive Crank: Not evaluated Anterior apprehension: Negative Posterior apprehension: Not evaluated  She is neurovascularly intact to the right upper extremity.  Imaging:  Shoulder X-Rays: Internally rotated and externally rotated AP views, as well as true AP and Y-scapular x-rays are available for review. These films demonstrate no evidence for fractures, lytic lesions, or significant degenerative changes. The subacromial space is well-maintained. There is no subacromial or infra-clavicular spurring. She demonstrates a Type I-II acromion.  Right Shoulder Imaging, MRI: MRI Shoulder Cartilage: Partial thickness humeral head cartilage loss. MRI Shoulder Rotator Cuff: No rotator cuff tear is present. No retraction. MRI Shoulder Labrum / Biceps: No labral tear or biceps abormality. MRI Shoulder Bone: Increased bone marrow edema involving the acromion and distal clavicle, suggestive of degenerative changes of the AC joint.  Impression: 1. Tendinitis of upper biceps tendon of right shoulder. 2. Rotator cuff tendinitis, right. 3. 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  Plan:  1. Treatment options were discussed today with the patient. 2. The patient is scheduled for a right shoulder arthroscopy with debridement, decompression, excision of distal clavicle and possible biceps tenodesis with Dr. Roland Rack on 01/24/2023. 3. The patient was instructed on the risk and benefits of surgical intervention and wishes to proceed at this time. 4. This document will serve as a surgical history and physical for the patient. 5. The patient will follow-up per standard postop protocol. They can call the clinic they have any questions, new symptoms develop or symptoms worsen.  The procedure was discussed with the patient, as were the potential risks (including bleeding, infection, nerve and/or blood vessel injury, persistent or recurrent pain, failure of the repair, progression of  arthritis, need for further surgery, blood clots, strokes, heart attacks and/or arhythmias, pneumonia, etc.) and benefits. The patient states her understanding and wishes to proceed.    H&P reviewed and patient re-examined. No changes.

## 2023-01-24 NOTE — Op Note (Signed)
01/24/2023  1:59 PM  Patient:   Monica Dominguez  Pre-Op Diagnosis:   Impingement/tendinopathy with biceps tendinitis and degenerative joint disease of AC joint, right shoulder.  Post-Op Diagnosis:   Impingement/tendinopathy with degenerative labral fraying, biceps tendinitis, and degenerative joint disease of AC joint, right shoulder.  Procedure:   Limited arthroscopic debridement, arthroscopic subacromial decompression, and arthroscopic distal clavicle excision, right shoulder.  Anesthesia:   General endotracheal with interscalene block using Exparel placed preoperatively by the anesthesiologist.  Surgeon:   Pascal Lux, MD  Assistant:   Cameron Proud, PA-C  Findings:   As above. There was moderate fraying/tearing of the superior and postero-superior portions of the glenoid labrum without frank detachment from the glenoid rim. The rotator cuff was in excellent condition. The biceps tendon demonstrated moderate "lip sticking without partial or full-thickness tearing. The articular surfaces of the glenoid and humerus both were in excellent condition.  Complications:   None  Fluids:   500 cc  Estimated blood loss:   5 cc  Tourniquet time:   None  Drains:   None  Closure:   Staples      Brief clinical note:   The patient is a 47 year old female with a 1 year history of right shoulder pain. The patient's symptoms have progressed despite medications, activity modification, etc. The patient's history and examination are consistent with impingement/tendinopathy with a possible rotator cuff tear. Her preoperative MRI scan demonstrated a possible superior labral tear but no partial or full-thickness tears of the rotator cuff. There was significant bone marrow edema involving both the distal clavicle and the anterior acromion, consistent with symptomatic degenerative joint disease of the [facility] joint. The patient presents at this time for definitive management of these shoulder  symptoms.  Procedure:   The patient underwent placement of an interscalene block using Exparel by the anesthesiologist in the preoperative holding area before being brought into the operating room and lain in the supine position. The patient then underwent general endotracheal intubation and anesthesia before being repositioned in the beach chair position using the beach chair positioner. The right shoulder and upper extremity were prepped with ChloraPrep solution before being draped sterilely. Preoperative antibiotics were administered. A timeout was performed to confirm the proper surgical site before the expected portal sites and incision site were injected with 0.5% Sensorcaine with epinephrine.   A posterior portal was created and the glenohumeral joint thoroughly inspected with the findings as described above. An anterior portal was created using an outside-in technique. The labrum and rotator cuff were further probed, again confirming the above-noted findings. The areas of labral fraying were debrided back to stable margins using the full-radius resector. The ArthroCare wand was inserted and used to release the biceps tendon from its labral anchor. Given the patient's size and activity level, it was felt that a formal biceps tenodesis was not necessary. The ArthroCare wand also was used to obtain hemostasis as well as to "anneal" the labrum superiorly and anteriorly. The instruments were removed from the joint after suctioning the excess fluid.  The camera was repositioned through the posterior portal into the subacromial space. A separate lateral portal was created using an outside-in technique. The 3.5 mm full-radius resector was introduced and used to perform a subtotal bursectomy. The ArthroCare wand was then inserted and used to remove the periosteal tissue off the undersurface of the anterior third of the acromion as well as to recess the coracoacromial ligament from its attachment along the  anterior and lateral margins  of the acromion. The 4.0 mm acromionizing bur was introduced and used to complete the decompression by removing the undersurface of the anterior third of the acromion. The full radius resector was reintroduced to remove any residual bony debris before the ArthroCare wand was reintroduced to obtain hemostasis.   The ArthroCare wand was used to denude the undersurface of the distal clavicle as well as to dissect anteriorly and posteriorly around the inferior half of the distal clavicle. The 4 mm acromionizer bur was then advanced medially to remove the undersurface of the distal clavicle. The camera was repositioned in the lateral portal and the acromionizer introduced through the anterior portal.  It was noted that there was already a decent space between the distal clavicle and acromion, measuring approximately 4 to 5 mm. Therefore, only the distal 5 mm or so of the lateral clavicle was removed using the 4 mm acromionizing bur. The camera was positioned through the anterior portal to verify adequate resection of the distal clavicle. Once this was verified, the camera was repositioned through the posterior portal and the rotator cuff carefully inspected. There was no evidence of bursal surface tearing. The instruments then were removed from the subacromial space after suctioning the excess fluid.  A single 2-0 Vicryl subcutaneous suture was placed in the anterior portals before all three portal sites were closed using staples.  Sterile occlusive dressings were applied to each wound before the arm was placed into a standard shoulder sling. The patient was then awakened, extubated, and returned to the recovery room in satisfactory condition after tolerating the procedure well.

## 2023-01-25 ENCOUNTER — Encounter: Payer: Self-pay | Admitting: Surgery

## 2023-02-03 ENCOUNTER — Other Ambulatory Visit: Payer: Self-pay

## 2023-02-22 ENCOUNTER — Other Ambulatory Visit: Payer: Self-pay

## 2023-02-24 ENCOUNTER — Other Ambulatory Visit: Payer: Self-pay

## 2023-02-25 ENCOUNTER — Other Ambulatory Visit: Payer: Self-pay

## 2023-02-25 MED ORDER — FAMOTIDINE 20 MG PO TABS
20.0000 mg | ORAL_TABLET | Freq: Two times a day (BID) | ORAL | 2 refills | Status: AC | PRN
  Filled 2023-02-25: qty 30, 15d supply, fill #0
  Filled 2023-03-14: qty 30, 15d supply, fill #1

## 2023-02-25 MED ORDER — NA SULFATE-K SULFATE-MG SULF 17.5-3.13-1.6 GM/177ML PO SOLN
ORAL | 0 refills | Status: DC
  Filled 2023-02-25 – 2023-05-24 (×2): qty 354, 1d supply, fill #0

## 2023-04-05 ENCOUNTER — Other Ambulatory Visit: Payer: Self-pay

## 2023-04-05 MED ORDER — LURASIDONE HCL 80 MG PO TABS
80.0000 mg | ORAL_TABLET | Freq: Every day | ORAL | 0 refills | Status: DC
  Filled 2023-04-05 – 2023-05-24 (×2): qty 90, 90d supply, fill #0

## 2023-04-05 MED ORDER — HYDROXYZINE PAMOATE 50 MG PO CAPS
50.0000 mg | ORAL_CAPSULE | Freq: Every day | ORAL | 0 refills | Status: DC | PRN
  Filled 2023-04-05: qty 90, 90d supply, fill #0

## 2023-04-05 MED ORDER — TRAZODONE HCL 150 MG PO TABS
150.0000 mg | ORAL_TABLET | Freq: Every day | ORAL | 0 refills | Status: DC
  Filled 2023-04-05: qty 90, 90d supply, fill #0

## 2023-04-17 ENCOUNTER — Other Ambulatory Visit: Payer: Self-pay

## 2023-05-07 ENCOUNTER — Other Ambulatory Visit: Payer: Self-pay

## 2023-05-10 ENCOUNTER — Other Ambulatory Visit: Payer: Self-pay

## 2023-05-10 DIAGNOSIS — E559 Vitamin D deficiency, unspecified: Secondary | ICD-10-CM | POA: Insufficient documentation

## 2023-05-10 DIAGNOSIS — F17200 Nicotine dependence, unspecified, uncomplicated: Secondary | ICD-10-CM | POA: Insufficient documentation

## 2023-05-10 DIAGNOSIS — K581 Irritable bowel syndrome with constipation: Secondary | ICD-10-CM | POA: Insufficient documentation

## 2023-05-10 MED ORDER — MOUNJARO 5 MG/0.5ML ~~LOC~~ SOAJ
5.0000 mg | SUBCUTANEOUS | 1 refills | Status: AC
  Filled 2023-05-10 – 2023-06-11 (×2): qty 2, 28d supply, fill #0
  Filled 2023-07-30: qty 2, 28d supply, fill #1
  Filled 2023-08-21: qty 2, 28d supply, fill #2
  Filled 2023-09-20: qty 2, 28d supply, fill #3
  Filled 2023-10-26: qty 2, 28d supply, fill #4
  Filled 2024-01-12: qty 2, 28d supply, fill #5

## 2023-05-10 MED ORDER — FAMOTIDINE 20 MG PO TABS
20.0000 mg | ORAL_TABLET | Freq: Every day | ORAL | 3 refills | Status: DC
  Filled 2023-05-10: qty 90, 90d supply, fill #0
  Filled 2023-08-21: qty 90, 90d supply, fill #1
  Filled 2023-11-20: qty 90, 90d supply, fill #2
  Filled 2024-02-19: qty 90, 90d supply, fill #3

## 2023-05-24 ENCOUNTER — Other Ambulatory Visit: Payer: Self-pay

## 2023-05-30 ENCOUNTER — Encounter: Payer: Self-pay | Admitting: Gastroenterology

## 2023-05-30 NOTE — H&P (Signed)
Pre-Procedure H&P   Patient ID: Monica Dominguez is a 47 y.o. female.  Gastroenterology Provider: [name], DO  Referring Provider: Tawni Pummel, PA PCP: Carren Rang, PA-C  Date: 05/31/2023  HPI [name] is a 47 y.o. female who presents today for Colonoscopy for Surveillance-personal history of colon polyps .  Patient with chronic constipation.  She trialed MiraLAX and was only having 1 BM per week.  She has since switched to senna and having daily bowel movements.  No melena or hematochezia.  She denies family history of colon cancer or colon polyps.  She has a history of adenomatous polyps appreciated on colonoscopy in November 2018 and October 2013.  Also noted to have internal hemorrhoids  Patient is currently on Mounjaro which has been held for this procedure (last dose 05/22/23)  Status post hysterectomy  Hemoglobin 15.8 MCV 92 platelets 260,000 creatinine 0.9   Past Medical History:  Diagnosis Date   Anxiety    Arthritis    Bipolar 2 disorder (HCC)    Bipolar disorder (HCC)    Cervical cancer (HCC)    CTS (carpal tunnel syndrome)    CTS (carpal tunnel syndrome)    Diabetes mellitus without complication (HCC)    Environmental allergies    GERD (gastroesophageal reflux disease)    History of kidney stones    Hyperlipemia    Obesity    Pneumonia    Sleep apnea    cpap    Past Surgical History:  Procedure Laterality Date   ABDOMINAL HYSTERECTOMY     CARPAL TUNNEL RELEASE Right 04/03/2013   COLONOSCOPY WITH PROPOFOL N/A 11/18/2017   Procedure: COLONOSCOPY WITH PROPOFOL;  Surgeon: Scot Jun, MD;  Location: [facility] ENDOSCOPY;  Service: Endoscopy;  Laterality: N/A;   DILATION AND CURETTAGE OF UTERUS     ESOPHAGOGASTRODUODENOSCOPY (EGD) WITH PROPOFOL N/A 07/13/2016   Procedure: ESOPHAGOGASTRODUODENOSCOPY (EGD) WITH PROPOFOL;  Surgeon: Scot Jun, MD;  Location: [facility] ENDOSCOPY;  Service: Endoscopy;  Laterality: N/A;    HEMORRHOID SURGERY N/A 11/09/2016   Procedure: HEMORRHOIDECTOMY;  Surgeon: Nadeen Landau, MD;  Location: [facility] ORS;  Service: General;  Laterality: N/A;   SHOULDER ARTHROSCOPY WITH SUBACROMIAL DECOMPRESSION, ROTATOR CUFF REPAIR AND BICEP TENDON REPAIR Right 01/24/2023   Procedure: SHOULDER ARTHROSCOPY WITH DEBRIDEMENT, DECOMPRESSION, EXCISION OF DISTAL CLAVICLE;  Surgeon: Christena Flake, MD;  Location: [facility] ORS;  Service: Orthopedics;  Laterality: Right;   TONSILLECTOMY     WISDOM TOOTH EXTRACTION      Family History No h/o GI disease or malignancy  Review of Systems  Constitutional:  Negative for activity change, appetite change, chills, diaphoresis, fatigue, fever and unexpected weight change.  HENT:  Negative for trouble swallowing and voice change.   Respiratory:  Negative for shortness of breath and wheezing.   Cardiovascular:  Negative for chest pain, palpitations and leg swelling.  Gastrointestinal:  Negative for abdominal distention, abdominal pain, anal bleeding, blood in stool, constipation, diarrhea, nausea, rectal pain and vomiting.  Musculoskeletal:  Negative for arthralgias and myalgias.  Skin:  Negative for color change and pallor.  Neurological:  Negative for dizziness, syncope and weakness.  Psychiatric/Behavioral:  Negative for confusion.   All other systems reviewed and are negative.    Medications No current facility-administered medications on file prior to encounter.   Current Outpatient Medications on File Prior to Encounter  Medication Sig Dispense Refill   BIOTIN PO Take 1 capsule by mouth daily.     CALCIUM-MAGNESIUM-VITAMIN D PO Take  1 tablet by mouth daily.     Cholecalciferol (VITAMIN D3) 50 MCG (2000 UT) capsule Take 2,000 Units by mouth daily.     latanoprost (XALATAN) 0.005 % ophthalmic solution Instill 1 drop into affected eye(s) once daily in the evening 5 mL 1   lovastatin (MEVACOR) 20 MG tablet Take 1 tablet (20 mg total) by mouth daily. 90  tablet 3   Continuous Blood Gluc Sensor (FREESTYLE LIBRE 2 SENSOR) MISC USE 1 KIT EVERY 14 (FOURTEEN) DAYS FOR GLUCOSE MONITORING 2 each 3   Continuous Blood Gluc Sensor (FREESTYLE LIBRE 2 SENSOR) MISC Apply a new sensor every 14 (fourteen) days for glucose monitoring 6 each 3   famotidine (PEPCID) 20 MG tablet Take 1 tablet (20 mg total) by mouth 2 (two) times daily as needed for heartburn. 30 tablet 2   Na Sulfate-K Sulfate-Mg Sulf 17.5-3.13-1.6 GM/177ML SOLN Take 1 Bottle by mouth as directed One kit contains 2 bottles.  Take both bottles at the times instructed by your provider. 354 mL 0   oxyCODONE (OXY IR/ROXICODONE) 5 MG immediate release tablet Take 1-2  tablets (5-10 mg total) by mouth every 4 (four) hours as needed for moderate pain or severe pain. 40 tablet 0   oxyCODONE (ROXICODONE) 5 MG immediate release tablet Take 1-2 tablets (5-10 mg total) by mouth every 4 (four) hours as needed for moderate pain or severe pain. 40 tablet 0   pantoprazole (PROTONIX) 40 MG tablet Take 1 tablet (40 mg total) by mouth once daily 90 tablet 3   pneumococcal 20-valent conjugate vaccine (PREVNAR 20) 0.5 ML injection Inject into the muscle. 0.5 mL 0    Pertinent medications related to GI and procedure were reviewed by me with the patient prior to the procedure   Current Facility-Administered Medications:    0.9 %  sodium chloride infusion, , Intravenous, Continuous, [name], DO  sodium chloride         Allergies  Allergen Reactions   Lamictal [Lamotrigine] Rash   Latex Hives   Tramadol Rash    And shortness of breath   Allergies were reviewed by me prior to the procedure  Objective   Body mass index is 42.51 kg/m. Vitals:   05/31/23 0735 05/31/23 0736  BP:  (!) 142/92  Pulse:  73  Resp:  17  Temp:  (!) 96.6 F (35.9 C)  TempSrc:  Temporal  SpO2:  98%  Weight: 105.4 kg   Height: 5\' 2"  (1.575 m)      Physical Exam Vitals and nursing note reviewed.  Constitutional:       General: She is not in acute distress.    Appearance: Normal appearance. She is obese. She is not ill-appearing, toxic-appearing or diaphoretic.  HENT:     Head: Normocephalic and atraumatic.     Nose: Nose normal.     Mouth/Throat:     Mouth: Mucous membranes are moist.     Pharynx: Oropharynx is clear.  Eyes:     General: No scleral icterus.    Extraocular Movements: Extraocular movements intact.  Cardiovascular:     Rate and Rhythm: Normal rate and regular rhythm.     Heart sounds: Normal heart sounds. No murmur heard.    No friction rub. No gallop.  Pulmonary:     Effort: Pulmonary effort is normal. No respiratory distress.     Breath sounds: Normal breath sounds. No wheezing, rhonchi or rales.  Abdominal:     General: Bowel sounds are normal. There  is no distension.     Palpations: Abdomen is soft.     Tenderness: There is no abdominal tenderness. There is no guarding or rebound.  Musculoskeletal:     Cervical back: Neck supple.     Right lower leg: No edema.     Left lower leg: No edema.  Skin:    General: Skin is warm and dry.     Coloration: Skin is not jaundiced or pale.  Neurological:     General: No focal deficit present.     Mental Status: She is alert and oriented to person, place, and time. Mental status is at baseline.  Psychiatric:        Mood and Affect: Mood normal.        Behavior: Behavior normal.        Thought Content: Thought content normal.        Judgment: Judgment normal.      Assessment:  [name] is a 47 y.o. female  who presents today for Colonoscopy for Surveillance-personal history of colon polyps .  Plan:  Colonoscopy with possible intervention today  Colonoscopy with possible biopsy, control of bleeding, polypectomy, and interventions as necessary has been discussed with the patient/patient representative. Informed consent was obtained from the patient/patient representative after explaining the indication, nature, and  risks of the procedure including but not limited to death, bleeding, perforation, missed neoplasm/lesions, cardiorespiratory compromise, and reaction to medications. Opportunity for questions was given and appropriate answers were provided. Patient/patient representative has verbalized understanding is amenable to undergoing the procedure.   [name], DO  [facility]  Portions of the record may have been created with voice recognition software. Occasional wrong-word or 'sound-a-like' substitutions may have occurred due to the inherent limitations of voice recognition software.  Read the chart carefully and recognize, using context, where substitutions may have occurred.

## 2023-05-31 ENCOUNTER — Encounter: Payer: Self-pay | Admitting: Gastroenterology

## 2023-05-31 ENCOUNTER — Ambulatory Visit
Admission: RE | Admit: 2023-05-31 | Discharge: 2023-05-31 | Disposition: A | Discharge status: Home / Self Care | Payer: BC Managed Care – PPO | Source: Ambulatory Visit | Attending: Gastroenterology | Admitting: Gastroenterology

## 2023-05-31 ENCOUNTER — Ambulatory Visit: Payer: BC Managed Care – PPO | Admitting: Anesthesiology

## 2023-05-31 ENCOUNTER — Encounter
Admission: RE | Disposition: A | Discharge status: Home / Self Care | Payer: Self-pay | Source: Ambulatory Visit | Attending: Gastroenterology

## 2023-05-31 DIAGNOSIS — E119 Type 2 diabetes mellitus without complications: Secondary | ICD-10-CM | POA: Diagnosis not present

## 2023-05-31 DIAGNOSIS — K621 Rectal polyp: Secondary | ICD-10-CM | POA: Diagnosis not present

## 2023-05-31 DIAGNOSIS — G4730 Sleep apnea, unspecified: Secondary | ICD-10-CM | POA: Insufficient documentation

## 2023-05-31 DIAGNOSIS — Z1211 Encounter for screening for malignant neoplasm of colon: Secondary | ICD-10-CM | POA: Diagnosis present

## 2023-05-31 DIAGNOSIS — K6389 Other specified diseases of intestine: Secondary | ICD-10-CM | POA: Diagnosis not present

## 2023-05-31 DIAGNOSIS — J449 Chronic obstructive pulmonary disease, unspecified: Secondary | ICD-10-CM | POA: Insufficient documentation

## 2023-05-31 DIAGNOSIS — K5730 Diverticulosis of large intestine without perforation or abscess without bleeding: Secondary | ICD-10-CM | POA: Diagnosis not present

## 2023-05-31 DIAGNOSIS — K219 Gastro-esophageal reflux disease without esophagitis: Secondary | ICD-10-CM | POA: Diagnosis not present

## 2023-05-31 DIAGNOSIS — Z79899 Other long term (current) drug therapy: Secondary | ICD-10-CM | POA: Insufficient documentation

## 2023-05-31 DIAGNOSIS — K5909 Other constipation: Secondary | ICD-10-CM | POA: Insufficient documentation

## 2023-05-31 HISTORY — PX: COLONOSCOPY WITH PROPOFOL: SHX5780

## 2023-05-31 SURGERY — COLONOSCOPY WITH PROPOFOL
Anesthesia: General

## 2023-05-31 MED ORDER — PROPOFOL 500 MG/50ML IV EMUL
INTRAVENOUS | Status: DC | PRN
  Administered 2023-05-31: 100 ug/kg/min via INTRAVENOUS

## 2023-05-31 MED ORDER — LIDOCAINE HCL (PF) 2 % IJ SOLN
INTRAMUSCULAR | Status: AC
  Filled 2023-05-31: qty 5

## 2023-05-31 MED ORDER — LIDOCAINE HCL (CARDIAC) PF 100 MG/5ML IV SOSY
PREFILLED_SYRINGE | INTRAVENOUS | Status: DC | PRN
  Administered 2023-05-31: 50 mg via INTRAVENOUS

## 2023-05-31 MED ORDER — SODIUM CHLORIDE 0.9 % IV SOLN: INTRAVENOUS | Status: DC

## 2023-05-31 MED ORDER — PROPOFOL 10 MG/ML IV BOLUS
INTRAVENOUS | Status: DC | PRN
  Administered 2023-05-31: 80 mg via INTRAVENOUS

## 2023-05-31 MED ORDER — PROPOFOL 10 MG/ML IV BOLUS
INTRAVENOUS | Status: AC
  Filled 2023-05-31: qty 40

## 2023-05-31 NOTE — Anesthesia Preprocedure Evaluation (Signed)
Anesthesia Evaluation  Patient identified by MRN, date of birth, ID band Patient awake    Reviewed: Allergy & Precautions, NPO status , Patient's Chart, lab work & pertinent test results  Airway Mallampati: III  TM Distance: >3 FB Neck ROM: full    Dental  (+) Teeth Intact   Pulmonary neg pulmonary ROS, sleep apnea , COPD, Current Smoker   Pulmonary exam normal  + decreased breath sounds      Cardiovascular Exercise Tolerance: Good negative cardio ROS Normal cardiovascular exam Rhythm:Regular Rate:Normal     Neuro/Psych     Bipolar Disorder   negative neurological ROS  negative psych ROS   GI/Hepatic negative GI ROS, Neg liver ROS,GERD  Medicated,,  Endo/Other  negative endocrine ROSdiabetes, Type 2    Renal/GU negative Renal ROS  negative genitourinary   Musculoskeletal  (+) Arthritis ,    Abdominal  (+) + obese  Peds negative pediatric ROS (+)  Hematology negative hematology ROS (+)   Anesthesia Other Findings Past Medical History: No date: Anxiety No date: Arthritis No date: Bipolar 2 disorder (HCC) No date: Bipolar disorder (HCC) No date: Cervical cancer (HCC) No date: CTS (carpal tunnel syndrome) No date: CTS (carpal tunnel syndrome) No date: Diabetes mellitus without complication (HCC) No date: Environmental allergies No date: GERD (gastroesophageal reflux disease) No date: History of kidney stones No date: Hyperlipemia No date: Obesity No date: Pneumonia No date: Sleep apnea     Comment:  cpap  Past Surgical History: No date: ABDOMINAL HYSTERECTOMY 04/03/2013: CARPAL TUNNEL RELEASE; Right 11/18/2017: COLONOSCOPY WITH PROPOFOL; N/A     Comment:  Procedure: COLONOSCOPY WITH PROPOFOL;  Surgeon: Scot Jun, MD;  Location: [facility] ENDOSCOPY;  Service:               Endoscopy;  Laterality: N/A; No date: DILATION AND CURETTAGE OF UTERUS 07/13/2016: ESOPHAGOGASTRODUODENOSCOPY (EGD)  WITH PROPOFOL; N/A     Comment:  Procedure: ESOPHAGOGASTRODUODENOSCOPY (EGD) WITH               PROPOFOL;  Surgeon: Scot Jun, MD;  Location: [facility]              ENDOSCOPY;  Service: Endoscopy;  Laterality: N/A; 11/09/2016: HEMORRHOID SURGERY; N/A     Comment:  Procedure: HEMORRHOIDECTOMY;  Surgeon: Nadeen Landau, MD;  Location: [facility] ORS;  Service: General;                Laterality: N/A; 01/24/2023: SHOULDER ARTHROSCOPY WITH SUBACROMIAL DECOMPRESSION,  ROTATOR CUFF REPAIR AND BICEP TENDON REPAIR; Right     Comment:  Procedure: SHOULDER ARTHROSCOPY WITH DEBRIDEMENT,               DECOMPRESSION, EXCISION OF DISTAL CLAVICLE;  Surgeon:               Christena Flake, MD;  Location: [facility] ORS;  Service:               Orthopedics;  Laterality: Right; No date: TONSILLECTOMY No date: WISDOM TOOTH EXTRACTION  BMI    Body Mass Index: 42.51 kg/m      Reproductive/Obstetrics negative OB ROS                             Anesthesia Physical Anesthesia Plan  ASA: 3  Anesthesia  Plan: General   Post-op Pain Management:    Induction: Intravenous  PONV Risk Score and Plan: Propofol infusion and TIVA  Airway Management Planned: Natural Airway  Additional Equipment:   Intra-op Plan:   Post-operative Plan:   Informed Consent: I have reviewed the patients History and Physical, chart, labs and discussed the procedure including the risks, benefits and alternatives for the proposed anesthesia with the patient or authorized representative who has indicated his/her understanding and acceptance.     Dental Advisory Given  Plan Discussed with: CRNA and Surgeon  Anesthesia Plan Comments:        Anesthesia Quick Evaluation

## 2023-05-31 NOTE — Interval H&P Note (Signed)
History and Physical Interval Note: Preprocedure H&P from 05/31/23  was reviewed and there was no interval change after seeing and examining the patient.  Written consent was obtained from the patient after discussion of risks, benefits, and alternatives. Patient has consented to proceed with Colonoscopy with possible intervention   05/31/2023 7:58 AM  Monica Dominguez  has presented today for surgery, with the diagnosis of PH Colonic Polyps.  The various methods of treatment have been discussed with the patient and family. After consideration of risks, benefits and other options for treatment, the patient has consented to  Procedure(s): COLONOSCOPY WITH PROPOFOL (N/A) as a surgical intervention.  The patient's history has been reviewed, patient examined, no change in status, stable for surgery.  I have reviewed the patient's chart and labs.  Questions were answered to the patient's satisfaction.     [name]

## 2023-05-31 NOTE — Op Note (Signed)
[facility] Gastroenterology Patient Name: Monica Dominguez Procedure Date: 05/31/2023 7:53 AM MRN: [id number] Account #: [id number] Date of Birth: 09-May-1976 Admit Type: Outpatient Age: 47 Room: [facility] ENDO ROOM 1 Gender: Female Note Status: Finalized Instrument Name: Colonscope [id number] Procedure:             Colonoscopy Indications:           High risk colon cancer surveillance: Personal history                         of colonic polyps Providers:             [name] DO, DO Medicines:             Monitored Anesthesia Care Complications:         No immediate complications. Estimated blood loss:                         Minimal. Procedure:             Pre-Anesthesia Assessment:                        - Prior to the procedure, a History and Physical was                         performed, and patient medications and allergies were                         reviewed. The patient is competent. The risks and                         benefits of the procedure and the sedation options and                         risks were discussed with the patient. All questions                         were answered and informed consent was obtained.                         Patient identification and proposed procedure were                         verified by the physician, the nurse, the anesthetist                         and the technician in the endoscopy suite. Mental                         Status Examination: alert and oriented. Airway                         Examination: normal oropharyngeal airway and neck                         mobility. Respiratory Examination: clear to                         auscultation. CV Examination: RRR, no murmurs, no S3  or S4. Prophylactic Antibiotics: The patient does not                         require prophylactic antibiotics. Prior                         Anticoagulants: The patient has taken no anticoagulant                          or antiplatelet agents. ASA Grade Assessment: III - A                         patient with severe systemic disease. After reviewing                         the risks and benefits, the patient was deemed in                         satisfactory condition to undergo the procedure. The                         anesthesia plan was to use monitored anesthesia care                         (MAC). Immediately prior to administration of                         medications, the patient was re-assessed for adequacy                         to receive sedatives. The heart rate, respiratory                         rate, oxygen saturations, blood pressure, adequacy of                         pulmonary ventilation, and response to care were                         monitored throughout the procedure. The physical                         status of the patient was re-assessed after the                         procedure.                        After obtaining informed consent, the colonoscope was                         passed under direct vision. Throughout the procedure,                         the patient's blood pressure, pulse, and oxygen                         saturations were monitored continuously. The  Colonoscope was introduced through the anus and                         advanced to the the cecum, identified by appendiceal                         orifice and ileocecal valve. The colonoscopy was                         performed without difficulty. The patient tolerated                         the procedure well. The quality of the bowel                         preparation was evaluated using the BBPS [facility] Bowel                         Preparation Scale) with scores of: Right Colon = 2                         (minor amount of residual staining, small fragments of                         stool and/or opaque liquid, but mucosa seen well),                          Transverse Colon = 2 (minor amount of residual                         staining, small fragments of stool and/or opaque                         liquid, but mucosa seen well) and Left Colon = 2                         (minor amount of residual staining, small fragments of                         stool and/or opaque liquid, but mucosa seen well). The                         total BBPS score equals 6. The quality of the bowel                         preparation was good. The ileocecal valve, appendiceal                         orifice, and rectum were photographed. Findings:      The perianal and digital rectal examinations were normal. Pertinent       negatives include normal sphincter tone.      Three sessile polyps were found in the rectum. The polyps were 1 to 2 mm       in size. These polyps were removed with a jumbo cold forceps. Resection       and retrieval were complete. Estimated blood loss was minimal.  A localized area of granular mucosa was found at the anus. Biopsies were       taken with a cold forceps for histology. Estimated blood loss was       minimal.      A localized area of moderate melanosis was found in the ascending colon       and in the cecum. Estimated blood loss: none.      A few small-mouthed diverticula were found in the sigmoid colon.       Estimated blood loss: none.      The exam was otherwise without abnormality on direct and retroflexion       views. Impression:            - Three 1 to 2 mm polyps in the rectum, removed with a                         jumbo cold forceps. Resected and retrieved.                        - Granularity at the anus. Biopsied.                        - Melanosis in the colon.                        - Diverticulosis in the sigmoid colon.                        - The examination was otherwise normal on direct and                         retroflexion views. Recommendation:        - Patient has a contact number available for                          emergencies. The signs and symptoms of potential                         delayed complications were discussed with the patient.                         Return to normal activities tomorrow. Written                         discharge instructions were provided to the patient.                        - Discharge patient to home.                        - Resume previous diet.                        - Continue present medications.                        - Await pathology results.                        - Repeat colonoscopy for surveillance based on  pathology results.                        - Return to referring physician as previously                         scheduled.                        - The findings and recommendations were discussed with                         the patient. Procedure Code(s):     --- Professional ---                        [phone], Colonoscopy, flexible; with biopsy, single or                         multiple Diagnosis Code(s):     --- Professional ---                        Z86.010, Personal history of colonic polyps                        D12.8, Benign neoplasm of rectum                        K62.89, Other specified diseases of anus and rectum                        K63.89, Other specified diseases of intestine                        K57.30, Diverticulosis of large intestine without                         perforation or abscess without bleeding CPT copyright 2022 [employer]. All rights reserved. The codes documented in this report are preliminary and upon coder review may  be revised to meet current compliance requirements. Attending Participation:      I personally performed the entire procedure. Elfredia Nevins, DO [name] DO, DO 05/31/2023 8:43:09 AM This report has been signed electronically. Number of Addenda: 0 Note Initiated On: 05/31/2023 7:53 AM Scope Withdrawal Time: 0 hours 18  minutes 8 seconds  Total Procedure Duration: 0 hours 22 minutes 14 seconds  Estimated Blood Loss:  Estimated blood loss was minimal.      [facility]

## 2023-05-31 NOTE — Anesthesia Postprocedure Evaluation (Signed)
Anesthesia Post Note  Patient: Monica Dominguez  Procedure(s) Performed: COLONOSCOPY WITH PROPOFOL  Patient location during evaluation: PACU Anesthesia Type: General Level of consciousness: awake and awake and alert Pain management: satisfactory to patient Vital Signs Assessment: vitals unstable Respiratory status: spontaneous breathing and nonlabored ventilation Cardiovascular status: stable Anesthetic complications: no   No notable events documented.   Last Vitals:  Vitals:   05/31/23 0852 05/31/23 0902  BP: 122/80 129/85  Pulse: 69 63  Resp: 17 19  Temp:    SpO2: 100% 100%    Last Pain:  Vitals:   05/31/23 0902  TempSrc:   PainSc: 0-No pain                 VAN STAVEREN,Katy Brickell

## 2023-05-31 NOTE — Transfer of Care (Signed)
Immediate Anesthesia Transfer of Care Note  Patient: Osvaldo Human  Procedure(s) Performed: COLONOSCOPY WITH PROPOFOL  Patient Location: PACU and Endoscopy Unit  Anesthesia Type:General  Level of Consciousness: awake, oriented, and patient cooperative  Airway & Oxygen Therapy: Patient Spontanous Breathing  Post-op Assessment: Report given to RN and Post -op Vital signs reviewed and stable  Post vital signs: Reviewed and stable  Last Vitals:  Vitals Value Taken Time  BP 115/74 0842  Temp    Pulse 75 05/31/23 0842  Resp 21 05/31/23 0842  SpO2 100 % 05/31/23 0842  Vitals shown include unvalidated device data.  Last Pain:  Vitals:   05/31/23 0736  TempSrc: Temporal  PainSc: 0-No pain         Complications: No notable events documented.

## 2023-06-01 NOTE — Progress Notes (Signed)
Voicemail.  No Message Left. 

## 2023-06-03 ENCOUNTER — Encounter: Payer: Self-pay | Admitting: Gastroenterology

## 2023-06-06 LAB — SURGICAL PATHOLOGY

## 2023-06-12 ENCOUNTER — Other Ambulatory Visit: Payer: Self-pay

## 2023-06-27 ENCOUNTER — Other Ambulatory Visit: Payer: Self-pay

## 2023-06-27 MED ORDER — LURASIDONE HCL 80 MG PO TABS
80.0000 mg | ORAL_TABLET | Freq: Every day | ORAL | 0 refills | Status: DC
  Filled 2023-06-27 – 2023-08-21 (×2): qty 90, 90d supply, fill #0

## 2023-06-27 MED ORDER — TRAZODONE HCL 150 MG PO TABS
150.0000 mg | ORAL_TABLET | Freq: Every day | ORAL | 0 refills | Status: DC
  Filled 2023-06-27: qty 90, 90d supply, fill #0

## 2023-06-27 MED ORDER — HYDROXYZINE PAMOATE 50 MG PO CAPS
50.0000 mg | ORAL_CAPSULE | Freq: Every day | ORAL | 0 refills | Status: AC | PRN
  Filled 2023-06-27: qty 90, 90d supply, fill #0

## 2023-08-21 ENCOUNTER — Other Ambulatory Visit: Payer: Self-pay

## 2023-09-06 ENCOUNTER — Other Ambulatory Visit: Payer: Self-pay

## 2023-09-17 ENCOUNTER — Other Ambulatory Visit: Payer: Self-pay

## 2023-09-17 MED ORDER — COMIRNATY 30 MCG/0.3ML IM SUSY
0.3000 mL | PREFILLED_SYRINGE | INTRAMUSCULAR | 0 refills | Status: AC
  Filled 2023-09-17: qty 0.3, 1d supply, fill #0

## 2023-09-17 MED ORDER — INFLUENZA VIRUS VACC SPLIT PF (FLUZONE) 0.5 ML IM SUSY
0.5000 mL | PREFILLED_SYRINGE | Freq: Once | INTRAMUSCULAR | 0 refills | Status: AC
  Filled 2023-09-17: qty 0.5, 1d supply, fill #0

## 2023-09-19 ENCOUNTER — Ambulatory Visit: Payer: BC Managed Care – PPO | Admitting: Dermatology

## 2023-09-19 ENCOUNTER — Encounter: Payer: Self-pay | Admitting: Dermatology

## 2023-09-19 DIAGNOSIS — D492 Neoplasm of unspecified behavior of bone, soft tissue, and skin: Secondary | ICD-10-CM

## 2023-09-19 DIAGNOSIS — L720 Epidermal cyst: Secondary | ICD-10-CM

## 2023-09-19 DIAGNOSIS — D1723 Benign lipomatous neoplasm of skin and subcutaneous tissue of right leg: Secondary | ICD-10-CM

## 2023-09-19 NOTE — Patient Instructions (Addendum)
Due to recent changes in healthcare laws, you may see results of your pathology and/or laboratory studies on MyChart before the doctors have had a chance to review them. We understand that in some cases there may be results that are confusing or concerning to you. Please understand that not all results are received at the same time and often the doctors may need to interpret multiple results in order to provide you with the best plan of care or course of treatment. Therefore, we ask that you please give [place] 2 business days to thoroughly review all your results before contacting the office for clarification. Should we see a critical lab result, you will be contacted sooner.   If You Need Anything After Your Visit  If you have any questions or concerns for your doctor, please call our main line at [phone] and press option 4 to reach your doctor's medical assistant. If no one answers, please leave a voicemail as directed and we will return your call as soon as possible. Messages left after 4 pm will be answered the following business day.   You may also send [place] a message via MyChart. We typically respond to MyChart messages within 1-2 business days.  For prescription refills, please ask your pharmacy to contact our office. Our fax number is [phone].  If you have an urgent issue when the clinic is closed that cannot wait until the next business day, you can page your doctor at the number below.    Please note that while we do our best to be available for urgent issues outside of office hours, we are not available 24/7.   If you have an urgent issue and are unable to reach [place], you may choose to seek medical care at your doctor's office, retail clinic, urgent care center, or emergency room.  If you have a medical emergency, please immediately call 911 or go to the emergency department.  Pager Numbers  - Dr. Gwen Pounds: [phone]  - Dr. Roseanne Reno: [phone]  - Dr. Katrinka Blazing: [phone]    In the event of inclement weather, please call our main line at [phone] for an update on the status of any delays or closures.  Dermatology Medication Tips: Please keep the boxes that topical medications come in in order to help keep track of the instructions about where and how to use these. Pharmacies typically print the medication instructions only on the boxes and not directly on the medication tubes.   If your medication is too expensive, please contact our office at [phone] option 4 or send [place] a message through MyChart.   We are unable to tell what your co-pay for medications will be in advance as this is different depending on your insurance coverage. However, we may be able to find a substitute medication at lower cost or fill out paperwork to get insurance to cover a needed medication.   If a prior authorization is required to get your medication covered by your insurance company, please allow [place] 1-2 business days to complete this process.  Drug prices often vary depending on where the prescription is filled and some pharmacies may offer cheaper prices.  The website www.goodrx.com contains coupons for medications through different pharmacies. The prices here do not account for what the cost may be with help from insurance (it may be cheaper with your insurance), but the website can give you the price if you did not use any insurance.  - You can print the associated coupon and take it  with your prescription to the pharmacy.  - You may also stop by our office during regular business hours and pick up a GoodRx coupon card.  - If you need your prescription sent electronically to a different pharmacy, notify our office through [facility] or by phone at [phone] option 4.     Si Usted Necesita Algo Despus de Su Visita  Tambin puede enviarnos un mensaje a travs de Clinical cytogeneticist. Por lo general respondemos a los mensajes de MyChart en el transcurso de 1 a 2 das  hbiles.  Para renovar recetas, por favor pida a su farmacia que se ponga en contacto con nuestra oficina. Annie Sable de fax es [place] [phone].  Si tiene un asunto urgente cuando la clnica est cerrada y que no puede esperar hasta el siguiente da hbil, puede llamar/localizar a su doctor(a) al nmero que aparece a continuacin.   Por favor, tenga en cuenta que aunque hacemos todo lo posible para estar disponibles para asuntos urgentes fuera del horario de [place], no estamos disponibles las 24 horas del da, los 7 [address]  [address] de la [place].   Si tiene un problema urgente y no puede comunicarse con nosotros, puede optar por buscar atencin mdica  en el consultorio de su doctor(a), en una clnica privada, en un centro de atencin urgente o en una sala de emergencias.  Si tiene Engineer, drilling, por favor llame inmediatamente al 911 o vaya a la sala de emergencias.  Nmeros de bper  - Dr. Gwen Pounds: [phone]  - Dra. Roseanne Reno: [phone]  - Dr. Katrinka Blazing: [phone]   En caso de inclemencias del tiempo, por favor llame a Lacy Duverney principal al [phone] para una actualizacin sobre el [place] de cualquier retraso o cierre.  Consejos para la medicacin en dermatologa: Por favor, guarde las cajas en las que vienen los medicamentos de uso tpico para ayudarle a seguir las instrucciones sobre dnde y cmo usarlos. Las farmacias generalmente imprimen las instrucciones del medicamento slo en las cajas y no directamente en los tubos del [place].   Si su medicamento es muy caro, por favor, pngase en contacto con Rolm Gala llamando al [phone] y presione la opcin 4 o envenos un mensaje a travs de Clinical cytogeneticist.   No podemos decirle cul ser su copago por los medicamentos por adelantado ya que esto es diferente dependiendo de la cobertura de su seguro. Sin embargo, es posible que podamos encontrar un medicamento sustituto a Audiological scientist un formulario para que el  seguro cubra el medicamento que se considera necesario.   Si se requiere una autorizacin previa para que su compaa de seguros [place] su medicamento, por favor permtanos de 1 a 2 das hbiles para completar [address]  Los precios de los medicamentos varan con frecuencia dependiendo del Environmental consultant de dnde se surte la receta y alguna farmacias pueden ofrecer precios ms baratos.  El sitio web www.goodrx.com tiene cupones para medicamentos de Health and safety inspector. Los precios aqu no tienen en cuenta lo que podra costar con la ayuda del seguro (puede ser ms barato con su seguro), pero el sitio web puede darle el precio si no utiliz Tourist information centre manager.  - Puede imprimir el cupn correspondiente y llevarlo con su receta a la farmacia.  - Tambin puede pasar por nuestra oficina durante el horario de atencin regular y Education officer, museum una tarjeta de cupones de GoodRx.  - Si necesita que su receta se enve electrnicamente a Psychiatrist, informe a nuestra oficina a travs de MyChart de  [place] o por telfono llamando al [phone] y presione la opcin 4.       Due to recent changes in healthcare laws, you may see results of your pathology and/or laboratory studies on MyChart before the doctors have had a chance to review them. We understand that in some cases there may be results that are confusing or concerning to you. Please understand that not all results are received at the same time and often the doctors may need to interpret multiple results in order to provide you with the best plan of care or course of treatment. Therefore, we ask that you please give [place] 2 business days to thoroughly review all your results before contacting the office for clarification. Should we see a critical lab result, you will be contacted sooner.   If You Need Anything After Your Visit  If you have any questions or concerns for your doctor, please call our main line at [phone] and press option 4 to reach  your doctor's medical assistant. If no one answers, please leave a voicemail as directed and we will return your call as soon as possible. Messages left after 4 pm will be answered the following business day.   You may also send [place] a message via MyChart. We typically respond to MyChart messages within 1-2 business days.  For prescription refills, please ask your pharmacy to contact our office. Our fax number is [phone].  If you have an urgent issue when the clinic is closed that cannot wait until the next business day, you can page your doctor at the number below.    Please note that while we do our best to be available for urgent issues outside of office hours, we are not available 24/7.   If you have an urgent issue and are unable to reach [place], you may choose to seek medical care at your doctor's office, retail clinic, urgent care center, or emergency room.  If you have a medical emergency, please immediately call 911 or go to the emergency department.  Pager Numbers  - Dr. Gwen Pounds: [phone]  - Dr. Roseanne Reno: [phone]  - Dr. Katrinka Blazing: [phone]   In the event of inclement weather, please call our main line at [phone] for an update on the status of any delays or closures.  Dermatology Medication Tips: Please keep the boxes that topical medications come in in order to help keep track of the instructions about where and how to use these. Pharmacies typically print the medication instructions only on the boxes and not directly on the medication tubes.   If your medication is too expensive, please contact our office at [phone] option 4 or send [place] a message through MyChart.   We are unable to tell what your co-pay for medications will be in advance as this is different depending on your insurance coverage. However, we may be able to find a substitute medication at lower cost or fill out paperwork to get insurance to cover a needed medication.   If a prior authorization  is required to get your medication covered by your insurance company, please allow [place] 1-2 business days to complete this process.  Drug prices often vary depending on where the prescription is filled and some pharmacies may offer cheaper prices.  The website www.goodrx.com contains coupons for medications through different pharmacies. The prices here do not account for what the cost may be with help from insurance (it may be cheaper with your insurance), but the website can give you  the price if you did not use any insurance.  - You can print the associated coupon and take it with your prescription to the pharmacy.  - You may also stop by our office during regular business hours and pick up a GoodRx coupon card.  - If you need your prescription sent electronically to a different pharmacy, notify our office through [facility] or by phone at [phone] option 4.     Si Usted Necesita Algo Despus de Su Visita  Tambin puede enviarnos un mensaje a travs de Clinical cytogeneticist. Por lo general respondemos a los mensajes de MyChart en el transcurso de 1 a 2 das hbiles.  Para renovar recetas, por favor pida a su farmacia que se ponga en contacto con nuestra oficina. Annie Sable de fax es [place] [phone].  Si tiene un asunto urgente cuando la clnica est cerrada y que no puede esperar hasta el siguiente da hbil, puede llamar/localizar a su doctor(a) al nmero que aparece a continuacin.   Por favor, tenga en cuenta que aunque hacemos todo lo posible para estar disponibles para asuntos urgentes fuera del horario de [place], no estamos disponibles las 24 horas del da, los 7 [address]  [address] de la [place].   Si tiene un problema urgente y no puede comunicarse con nosotros, puede optar por buscar atencin mdica  en el consultorio de su doctor(a), en una clnica privada, en un centro de atencin urgente o en una sala de emergencias.  Si tiene Engineer, drilling, por favor llame inmediatamente al 911 o  vaya a la sala de emergencias.  Nmeros de bper  - Dr. Gwen Pounds: [phone]  - Dra. Roseanne Reno: [phone]  - Dr. Katrinka Blazing: [phone]   En caso de inclemencias del tiempo, por favor llame a Lacy Duverney principal al [phone] para una actualizacin sobre el [place] de cualquier retraso o cierre.  Consejos para la medicacin en dermatologa: Por favor, guarde las cajas en las que vienen los medicamentos de uso tpico para ayudarle a seguir las instrucciones sobre dnde y cmo usarlos. Las farmacias generalmente imprimen las instrucciones del medicamento slo en las cajas y no directamente en los tubos del [place].   Si su medicamento es muy caro, por favor, pngase en contacto con Rolm Gala llamando al [phone] y presione la opcin 4 o envenos un mensaje a travs de Clinical cytogeneticist.   No podemos decirle cul ser su copago por los medicamentos por adelantado ya que esto es diferente dependiendo de la cobertura de su seguro. Sin embargo, es posible que podamos encontrar un medicamento sustituto a Audiological scientist un formulario para que el seguro cubra el medicamento que se considera necesario.   Si se requiere una autorizacin previa para que su compaa de seguros [place] su medicamento, por favor permtanos de 1 a 2 das hbiles para completar [address]  Los precios de los medicamentos varan con frecuencia dependiendo del Environmental consultant de dnde se surte la receta y alguna farmacias pueden ofrecer precios ms baratos.  El sitio web www.goodrx.com tiene cupones para medicamentos de Health and safety inspector. Los precios aqu no tienen en cuenta lo que podra costar con la ayuda del seguro (puede ser ms barato con su seguro), pero el sitio web puede darle el precio si no utiliz Tourist information centre manager.  - Puede imprimir el cupn correspondiente y llevarlo con su receta a la farmacia.  - Tambin puede pasar por nuestra oficina durante el horario de atencin regular y Education officer, museum una tarjeta de cupones  de GoodRx.  - Si  necesita que su receta se enve electrnicamente a una farmacia diferente, informe a nuestra oficina a travs de MyChart de [place] o por telfono llamando al [phone] y presione la opcin 4.

## 2023-09-19 NOTE — Progress Notes (Signed)
   New Patient Visit   Subjective  Monica Dominguez is a 47 y.o. female who presents for the following: Patient c/o tag mole on the right thigh for ~ 1 year, growing and irritating. Check a growth on the left neck that appeared ~6 months ago, no symptoms just will not go away.   The patient has spots, moles and lesions to be evaluated, some may be new or changing and the patient may have concern these could be cancer.  Daughter with patient.    The following portions of the chart were reviewed this encounter and updated as appropriate: medications, allergies, medical history  Review of Systems:  No other skin or systemic complaints except as noted in HPI or Assessment and Plan.  Objective  Well appearing patient in no apparent distress; mood and affect are within normal limits.  A focused examination was performed of the following areas:face,neck   Relevant physical exam findings are noted in the Assessment and Plan.  Right Medial Thigh 12 mm skin-colored pedunculated papulonodular       left neck 7 mm subcutaneous nodule on left neck    Assessment & Plan   Neoplasm of skin Right Medial Thigh  Epidermal / dermal shaving  Lesion diameter (cm):  1.2 Informed consent: discussed and consent obtained   Timeout: patient name, date of birth, surgical site, and procedure verified   Procedure prep:  Patient was prepped and draped in usual sterile fashion Prep type:  Isopropyl alcohol Anesthesia: the lesion was anesthetized in a standard fashion   Anesthetic:  1% lidocaine w/ epinephrine 1-100,000 buffered w/ 8.4% NaHCO3 Hemostasis achieved with: pressure, aluminum chloride and electrodesiccation   Outcome: patient tolerated procedure well   Post-procedure details: sterile dressing applied and wound care instructions given   Dressing type: bandage and petrolatum    Specimen 1 - Surgical pathology Differential Diagnosis: acrochordon vs nevus vs nevus lipomatosus  superficialis vs condyloma  Check Margins: No  Epidermal cyst left neck  Benign-appearing. Exam most consistent with an epidermal inclusion cyst. Discussed that a cyst is a benign growth that can grow over time and sometimes get irritated or inflamed. Recommend observation if it is not bothersome. Discussed option of surgical excision to remove it if it is growing, symptomatic, or other changes noted. Please call for new or changing lesions so they can be evaluated.   Return for surgery removal    Return for surgery for cyst excision .  IAngelique Holm, CMA, am acting as scribe for Elie Goody, MD .   Documentation: I have reviewed the above documentation for accuracy and completeness, and I agree with the above.  Elie Goody, MD

## 2023-09-20 ENCOUNTER — Other Ambulatory Visit: Payer: Self-pay

## 2023-09-20 MED ORDER — MAGNESIUM GLYCINATE 100 MG PO CAPS
100.0000 mg | ORAL_CAPSULE | Freq: Every evening | ORAL | 0 refills | Status: AC | PRN
  Filled 2023-09-20: qty 30, fill #0

## 2023-09-20 MED ORDER — HYDROXYZINE PAMOATE 50 MG PO CAPS
50.0000 mg | ORAL_CAPSULE | Freq: Every day | ORAL | 0 refills | Status: DC | PRN
  Filled 2023-09-20: qty 90, 90d supply, fill #0

## 2023-09-20 MED ORDER — LURASIDONE HCL 80 MG PO TABS
80.0000 mg | ORAL_TABLET | Freq: Every day | ORAL | 0 refills | Status: DC
  Filled 2023-09-20 – 2023-11-20 (×2): qty 90, 90d supply, fill #0

## 2023-09-20 MED ORDER — TRAZODONE HCL 150 MG PO TABS
150.0000 mg | ORAL_TABLET | Freq: Every day | ORAL | 0 refills | Status: DC
  Filled 2023-09-20: qty 90, 90d supply, fill #0

## 2023-09-23 ENCOUNTER — Other Ambulatory Visit: Payer: Self-pay

## 2023-09-24 LAB — SURGICAL PATHOLOGY

## 2023-10-02 ENCOUNTER — Other Ambulatory Visit: Payer: Self-pay

## 2023-10-02 MED ORDER — LATANOPROST 0.005 % OP SOLN
1.0000 [drp] | Freq: Every evening | OPHTHALMIC | 1 refills | Status: AC
  Filled 2023-10-02: qty 2.5, 50d supply, fill #0
  Filled 2024-04-22: qty 2.5, 50d supply, fill #1
  Filled 2024-07-13: qty 2.5, 50d supply, fill #2

## 2023-10-09 ENCOUNTER — Telehealth: Payer: Self-pay

## 2023-10-09 ENCOUNTER — Ambulatory Visit: Payer: BC Managed Care – PPO | Admitting: Dermatology

## 2023-10-09 ENCOUNTER — Other Ambulatory Visit: Payer: Self-pay

## 2023-10-09 ENCOUNTER — Encounter: Payer: Self-pay | Admitting: Dermatology

## 2023-10-09 VITALS — BP 129/80 | HR 84

## 2023-10-09 DIAGNOSIS — D485 Neoplasm of uncertain behavior of skin: Secondary | ICD-10-CM

## 2023-10-09 DIAGNOSIS — M67441 Ganglion, right hand: Secondary | ICD-10-CM

## 2023-10-09 DIAGNOSIS — M67449 Ganglion, unspecified hand: Secondary | ICD-10-CM

## 2023-10-09 DIAGNOSIS — D492 Neoplasm of unspecified behavior of bone, soft tissue, and skin: Secondary | ICD-10-CM

## 2023-10-09 DIAGNOSIS — L720 Epidermal cyst: Secondary | ICD-10-CM | POA: Diagnosis not present

## 2023-10-09 MED ORDER — MUPIROCIN 2 % EX OINT
1.0000 | TOPICAL_OINTMENT | Freq: Every day | CUTANEOUS | 0 refills | Status: AC
  Filled 2023-10-09: qty 22, 30d supply, fill #0

## 2023-10-09 NOTE — Patient Instructions (Signed)

## 2023-10-09 NOTE — Progress Notes (Signed)
   Follow-Up Visit   Subjective  Monica Dominguez is a 47 y.o. female who presents for the following: Excision of cyst at neck  The following portions of the chart were reviewed this encounter and updated as appropriate: medications, allergies, medical history  Review of Systems:  No other skin or systemic complaints except as noted in HPI or Assessment and Plan.  Objective  Well appearing patient in no apparent distress; mood and affect are within normal limits.  A focused examination was performed of the following areas: neck Relevant physical exam findings are noted in the Assessment and Plan.   left neck 8 mm subcutaneous nodule on left neck     Assessment & Plan   Neoplasm of uncertain behavior of skin left neck  Skin excision  Lesion length (cm):  0.8 Lesion width (cm):  0.8 Total excision diameter (cm):  0.8 Informed consent: discussed and consent obtained   Timeout: patient name, date of birth, surgical site, and procedure verified   Procedure prep:  Patient was prepped and draped in usual sterile fashion Prep type:  Chlorhexidine Anesthesia: the lesion was anesthetized in a standard fashion   Anesthetic:  1% lidocaine w/ epinephrine 1-100,000 buffered w/ 8.4% NaHCO3 (2.5 cc lido w/epi) Instrument used: #15 blade   Hemostasis achieved with: pressure   Outcome: patient tolerated procedure well with no complications    Skin repair Complexity:  Intermediate Final length (cm):  2.5 Informed consent: discussed and consent obtained   Timeout: patient name, date of birth, surgical site, and procedure verified   Procedure prep:  Patient was prepped and draped in usual sterile fashion Prep type:  Chlorhexidine Anesthesia: the lesion was anesthetized in a standard fashion   Anesthetic:  1% lidocaine w/ epinephrine 1-100,000 buffered w/ 8.4% NaHCO3 Reason for type of repair: reduce tension to allow closure, reduce the risk of dehiscence, infection, and necrosis,  reduce subcutaneous dead space and avoid a hematoma, allow closure of the large defect and preserve normal anatomy   Undermining: edges could be approximated without difficulty   Subcutaneous layers (deep stitches):  Suture size:  4-0 Suture type: Monocryl (poliglecaprone 25)   Stitches:  Buried vertical mattress Fine/surface layer approximation (top stitches):  Suture size:  5-0 Suture type: Prolene (polypropylene)   Stitches: simple running   Suture removal (days):  7 Hemostasis achieved with: suture, pressure and electrodesiccation Outcome: patient tolerated procedure well with no complications   Post-procedure details: sterile dressing applied and wound care instructions given   Dressing type: petrolatum, bandage and pressure dressing    Specimen 1 - Surgical pathology Differential Diagnosis: Cyst vs Other  Check Margins: No 7 mm subcutaneous nodule on left neck  Digital mucous cyst  Related Procedures Ambulatory referral to Hand Surgery  - papule on R 4th palmar finger. New per patient. Suspect digital mucous cyst. Sent referral to Hand Surgery for evaluation   Return in about 1 week (around 10/16/2023) for suture removal take photo to show Dr. Katrinka Blazing.  Anise Salvo, RMA, am acting as scribe for Elie Goody, MD .   Documentation: I have reviewed the above documentation for accuracy and completeness, and I agree with the above.  Elie Goody, MD

## 2023-10-09 NOTE — Telephone Encounter (Signed)
Patient doing fine after surgery this morning. Butch Penny., RMA

## 2023-10-11 LAB — SURGICAL PATHOLOGY

## 2023-10-15 ENCOUNTER — Other Ambulatory Visit: Payer: Self-pay

## 2023-10-15 ENCOUNTER — Ambulatory Visit: Payer: Self-pay | Admitting: Dermatology

## 2023-10-15 MED ORDER — LINZESS 72 MCG PO CAPS
72.0000 ug | ORAL_CAPSULE | Freq: Every day | ORAL | 3 refills | Status: DC
  Filled 2023-10-15: qty 30, 30d supply, fill #0
  Filled 2023-11-11: qty 30, 30d supply, fill #1

## 2023-10-15 MED ORDER — PANTOPRAZOLE SODIUM 40 MG PO TBEC
40.0000 mg | DELAYED_RELEASE_TABLET | Freq: Every day | ORAL | 3 refills | Status: DC
  Filled 2023-10-15: qty 90, 90d supply, fill #0
  Filled 2023-11-11 – 2024-01-12 (×2): qty 90, 90d supply, fill #1
  Filled 2024-04-22: qty 90, 90d supply, fill #2
  Filled 2024-07-13: qty 90, 90d supply, fill #3

## 2023-10-16 ENCOUNTER — Ambulatory Visit (INDEPENDENT_AMBULATORY_CARE_PROVIDER_SITE_OTHER): Payer: BC Managed Care – PPO

## 2023-10-16 DIAGNOSIS — Z4802 Encounter for removal of sutures: Secondary | ICD-10-CM

## 2023-10-16 NOTE — Progress Notes (Signed)
Encounter for Removal of Sutures - Incision site at the left neck is clean, dry and intact - Wound cleansed, sutures removed, wound cleansed and steri strips applied.  - Discussed pathology results showing epidermal cyst.  - Patient advised to keep steri-strips dry until they fall off. - Scars remodel for a full year. - Once steri-strips fall off, patient can apply over-the-counter silicone scar cream each night to help with scar remodeling if desired. - Patient advised to call with any concerns or if they notice any new or changing lesions. -Photo taken.  Dorathy Daft, RMA

## 2023-11-06 ENCOUNTER — Other Ambulatory Visit: Payer: Self-pay

## 2023-11-06 MED ORDER — LATANOPROST 0.005 % OP SOLN
1.0000 [drp] | Freq: Every evening | OPHTHALMIC | 1 refills | Status: AC
  Filled 2023-11-06: qty 2.5, 50d supply, fill #0

## 2023-11-11 ENCOUNTER — Other Ambulatory Visit: Payer: Self-pay

## 2023-11-18 ENCOUNTER — Other Ambulatory Visit: Payer: Self-pay

## 2023-11-18 MED ORDER — FREESTYLE LIBRE 2 SENSOR MISC
3 refills | Status: AC
  Filled 2023-11-18: qty 6, 90d supply, fill #0
  Filled 2024-11-14: qty 6, 84d supply, fill #0

## 2023-11-18 MED ORDER — MOUNJARO 5 MG/0.5ML ~~LOC~~ SOAJ
5.0000 mg | SUBCUTANEOUS | 3 refills | Status: DC
  Filled 2023-11-18: qty 2, 28d supply, fill #0
  Filled 2023-12-19: qty 2, 28d supply, fill #1
  Filled 2024-01-12 – 2024-02-19 (×2): qty 2, 28d supply, fill #2
  Filled 2024-03-14: qty 2, 28d supply, fill #3
  Filled 2024-04-22: qty 2, 28d supply, fill #4
  Filled 2024-05-31: qty 2, 28d supply, fill #5
  Filled 2024-07-13: qty 2, 28d supply, fill #6
  Filled 2024-08-12: qty 2, 28d supply, fill #7
  Filled 2024-09-06: qty 2, 28d supply, fill #8
  Filled 2024-10-02: qty 2, 28d supply, fill #9
  Filled 2024-11-14: qty 2, 28d supply, fill #10

## 2023-11-18 MED ORDER — LOVASTATIN 20 MG PO TABS
20.0000 mg | ORAL_TABLET | Freq: Every day | ORAL | 3 refills | Status: DC
  Filled 2023-11-18 – 2023-11-20 (×2): qty 90, 90d supply, fill #0
  Filled 2024-02-19: qty 90, 90d supply, fill #1
  Filled 2024-05-31: qty 90, 90d supply, fill #2
  Filled 2024-09-06: qty 90, 90d supply, fill #3

## 2023-11-18 MED ORDER — CYCLOBENZAPRINE HCL 5 MG PO TABS
5.0000 mg | ORAL_TABLET | Freq: Every evening | ORAL | 0 refills | Status: DC | PRN
  Filled 2023-11-18: qty 30, 30d supply, fill #0

## 2023-11-19 ENCOUNTER — Other Ambulatory Visit: Payer: Self-pay

## 2023-11-20 ENCOUNTER — Other Ambulatory Visit: Payer: Self-pay

## 2023-11-21 ENCOUNTER — Other Ambulatory Visit: Payer: Self-pay

## 2023-11-21 MED ORDER — LINZESS 145 MCG PO CAPS
145.0000 ug | ORAL_CAPSULE | Freq: Every day | ORAL | 1 refills | Status: AC
  Filled 2023-11-21: qty 90, 90d supply, fill #0
  Filled 2024-02-19: qty 90, 90d supply, fill #1

## 2023-11-22 ENCOUNTER — Other Ambulatory Visit: Payer: Self-pay

## 2023-11-25 ENCOUNTER — Other Ambulatory Visit: Payer: Self-pay | Admitting: Student

## 2023-11-25 DIAGNOSIS — Z1231 Encounter for screening mammogram for malignant neoplasm of breast: Secondary | ICD-10-CM

## 2023-12-18 ENCOUNTER — Other Ambulatory Visit: Payer: Self-pay

## 2023-12-18 MED ORDER — LURASIDONE HCL 80 MG PO TABS
80.0000 mg | ORAL_TABLET | Freq: Every day | ORAL | 0 refills | Status: DC
  Filled 2023-12-18 – 2024-11-14 (×3): qty 90, 90d supply, fill #0

## 2023-12-18 MED ORDER — TRAZODONE HCL 150 MG PO TABS
150.0000 mg | ORAL_TABLET | Freq: Every day | ORAL | 0 refills | Status: AC
  Filled 2023-12-18: qty 90, 90d supply, fill #0

## 2023-12-18 MED ORDER — HYDROXYZINE PAMOATE 50 MG PO CAPS
50.0000 mg | ORAL_CAPSULE | Freq: Every day | ORAL | 0 refills | Status: DC | PRN
  Filled 2023-12-18: qty 90, 90d supply, fill #0

## 2023-12-20 ENCOUNTER — Other Ambulatory Visit: Payer: Self-pay

## 2023-12-30 ENCOUNTER — Ambulatory Visit: Payer: BC Managed Care – PPO | Admitting: Orthopedic Surgery

## 2023-12-31 ENCOUNTER — Ambulatory Visit
Admission: RE | Admit: 2023-12-31 | Discharge: 2023-12-31 | Disposition: A | Discharge status: Home / Self Care | Payer: BC Managed Care – PPO | Source: Ambulatory Visit | Attending: Student | Admitting: Student

## 2023-12-31 DIAGNOSIS — Z1231 Encounter for screening mammogram for malignant neoplasm of breast: Secondary | ICD-10-CM | POA: Insufficient documentation

## 2024-01-08 ENCOUNTER — Ambulatory Visit: Payer: BC Managed Care – PPO | Admitting: Orthopedic Surgery

## 2024-01-13 ENCOUNTER — Other Ambulatory Visit: Payer: Self-pay

## 2024-01-14 ENCOUNTER — Ambulatory Visit: Payer: BC Managed Care – PPO | Admitting: Orthopedic Surgery

## 2024-01-14 ENCOUNTER — Other Ambulatory Visit (INDEPENDENT_AMBULATORY_CARE_PROVIDER_SITE_OTHER): Payer: BC Managed Care – PPO

## 2024-01-14 DIAGNOSIS — M79641 Pain in right hand: Secondary | ICD-10-CM | POA: Diagnosis not present

## 2024-01-14 DIAGNOSIS — R2231 Localized swelling, mass and lump, right upper limb: Secondary | ICD-10-CM

## 2024-01-14 NOTE — Progress Notes (Signed)
 Monica Dominguez - 48 y.o. female MRN [id number]  Date of birth: 06-Nov-1976  Office Visit Note: Visit Date: 01/14/2024 PCP: Franchot Houston, PA-C Referred by: Claudene Lehmann, MD  Subjective: No chief complaint on file.  HPI: Monica Dominguez is a pleasant 48 y.o. female who presents today for right ring finger mass that is been present now for greater than 6 months.  She states that this is painful in nature, has grown in size slightly recently.  No previous history of any masses throughout the hand did require previous treatment.  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  She denies any numbness or tingling.  No previous treatment modalities have been tried.  Pertinent ROS were reviewed with the patient and found to be negative unless otherwise specified above in HPI.   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 Right hand pain     Plan: Extensive discussion was had with the patient today regarding her right ring finger mass.  Given her ongoing symptoms in this region with associated pain, she is interested in surgical excision.  We discussed various possibilities for the mass in question, as well as conservative versus surgical treatment modalities.  Differential for the mass would include ganglion cyst, foreign body granuloma, venous or arterial malformation, giant cell tumor of tendon sheath, fibroma of tendon sheath, hemangioma versus potential glomus tumor as well.  From a surgical standpoint, we discussed mass excision which could be performed in the outpatient surgical setting under local  anesthesia.  Excised mass would then be sent for surgical pathology to confirm diagnosis.  Risks and benefits of the procedure were discussed, risks including but not limited to infection, bleeding, scarring, stiffness, nerve injury, tendon injury, vascular injury, recurrence of symptoms and need for subsequent operation.  Specifically for her mass given that it is located on the radial border at the level of the DIP, near the neurovascular bundle, I did discuss the possibility for persistent numbness in this region status post mass excision.  Patient expressed understanding.      Follow-up: No follow-ups on file.   Meds & Orders: No orders of the defined types were placed in this encounter.   Orders Placed This Encounter  Procedures   XR Finger Ring Right     Procedures: No procedures performed      Clinical History: No specialty comments available.  She reports that she has been smoking cigarettes. She has a 10 pack-year smoking history. She has never used smokeless tobacco. No results for input(s): HGBA1C, LABURIC in the last 8760 hours.  Objective:   Vital Signs: There were no vitals taken for this visit.  Physical Exam  Gen: Well-appearing, in no acute distress; non-toxic CV: Regular Rate. Well-perfused. Warm.  Resp: Breathing unlabored on room air; no wheezing. Psych: Fluid speech in conversation; appropriate affect; normal thought process  Ortho Exam Right ring finger: - Palpable mass over the radial border just proximal to the level of the DIP, measures approximately 0.5 cm x 0.5 cm, tender to palpation, slightly mobile, soft and compressible - Range of motion is well-preserved at the DIP and PIP without restriction - Sensation intact distally over  both the radial and ulnar border - Capillary refill is appropriate, digit is well-perfused  Imaging: XR Finger Ring Right Result Date: 01/14/2024 X-rays of the right ring finger, multiple views were performed today  X-rays demonstrate stable appearance of the DIP and PIP in all planes without significant bony abnormalities.   Past Medical/Family/Surgical/Social History: Medications & Allergies reviewed per EMR, new medications updated. Patient Active Problem List   Diagnosis Date Noted   External hemorrhoid 09/20/2016   History of cervical cancer 06/12/2016   Family history of breast cancer in mother 06/12/2016   Past Medical History:  Diagnosis Date   Anxiety    Arthritis    Bipolar 2 disorder (HCC)    Bipolar disorder (HCC)    Cervical cancer (HCC)    CTS (carpal tunnel syndrome)    CTS (carpal tunnel syndrome)    Diabetes mellitus without complication (HCC)    Environmental allergies    GERD (gastroesophageal reflux disease)    History of kidney stones    Hyperlipemia    Obesity    Pneumonia    Sleep apnea    cpap   Family History  Problem Relation Age of Onset   Diabetes Mother    Breast cancer Mother 104       s/p mastectomy   Diabetes Father    Colon polyps Father        approx 2 polyps   Breast cancer Maternal Aunt        dx. 39-40   Breast cancer Maternal Grandmother 58       dx. 64-65   Lung cancer Maternal Grandfather        d. 42; smoker   Cervical cancer Paternal Grandmother        d. 39 or older   Breast cancer Cousin    Lung cancer Other        maternal great uncle (MGM's brother); cigar smoker   Lung cancer Other        maternal great grandmother (MGM's mother); not a smoker   Past Surgical History:  Procedure Laterality Date   ABDOMINAL HYSTERECTOMY     CARPAL TUNNEL RELEASE Right 04/03/2013   COLONOSCOPY WITH PROPOFOL  N/A 11/18/2017   Procedure: COLONOSCOPY WITH PROPOFOL ;  Surgeon: Viktoria Lamar DASEN, MD;  Location: [facility] ENDOSCOPY;  Service: Endoscopy;  Laterality: N/A;   COLONOSCOPY WITH PROPOFOL  N/A 05/31/2023   Procedure: COLONOSCOPY WITH PROPOFOL ;  Surgeon: Onita Elspeth Sharper, DO;  Location: [facility] ENDOSCOPY;  Service: Gastroenterology;  Laterality:  N/A;   DILATION AND CURETTAGE OF UTERUS     ESOPHAGOGASTRODUODENOSCOPY (EGD) WITH PROPOFOL  N/A 07/13/2016   Procedure: ESOPHAGOGASTRODUODENOSCOPY (EGD) WITH PROPOFOL ;  Surgeon: Lamar DASEN Viktoria, MD;  Location: [facility] ENDOSCOPY;  Service: Endoscopy;  Laterality: N/A;   HEMORRHOID SURGERY N/A 11/09/2016   Procedure: HEMORRHOIDECTOMY;  Surgeon: Larinda Unknown Sharps, MD;  Location: [facility] ORS;  Service: General;  Laterality: N/A;   SHOULDER ARTHROSCOPY WITH SUBACROMIAL DECOMPRESSION, ROTATOR CUFF REPAIR AND BICEP TENDON REPAIR Right 01/24/2023   Procedure: SHOULDER ARTHROSCOPY WITH DEBRIDEMENT, DECOMPRESSION, EXCISION OF DISTAL CLAVICLE;  Surgeon: Edie Norleen PARAS, MD;  Location: [facility] ORS;  Service: Orthopedics;  Laterality: Right;   TONSILLECTOMY     WISDOM TOOTH EXTRACTION     Social History   Occupational History   Not on file  Tobacco Use   Smoking status: Some Days    Current packs/day: 1.00    Average packs/day: 1 pack/day for 10.0 years (10.0 ttl pk-yrs)    Types: Cigarettes  Smokeless tobacco: Never   Tobacco comments:    quit smoking about 5-10 years ago (06/12/2016)  Vaping Use   Vaping status: Never Used  Substance and Sexual Activity   Alcohol use: No   Drug use: No   Sexual activity: Not on file    Chikita Dogan Estela) Arlinda, M.D.  [facility]

## 2024-01-15 ENCOUNTER — Other Ambulatory Visit: Payer: Self-pay | Admitting: Orthopedic Surgery

## 2024-01-15 MED ORDER — OXYCODONE HCL 5 MG PO TABS
5.0000 mg | ORAL_TABLET | Freq: Four times a day (QID) | ORAL | 0 refills | Status: AC | PRN
Start: 2024-01-15 — End: ?

## 2024-01-16 DIAGNOSIS — R2231 Localized swelling, mass and lump, right upper limb: Secondary | ICD-10-CM | POA: Diagnosis not present

## 2024-01-30 ENCOUNTER — Ambulatory Visit: Payer: BC Managed Care – PPO | Admitting: Orthopedic Surgery

## 2024-01-30 DIAGNOSIS — R2231 Localized swelling, mass and lump, right upper limb: Secondary | ICD-10-CM

## 2024-01-30 NOTE — Progress Notes (Signed)
   Monica Dominguez - 48 y.o. female MRN [id number]  Date of birth: Mar 20, 1976  Office Visit Note: Visit Date: 01/30/2024 PCP: Carren Rang, PA-C Referred by: Carren Rang, PA-C  Subjective:  HPI: Monica Dominguez is a 48 y.o. female who presents today for follow up 2 weeks status post right ring finger mass excision.  He is doing well overall, pain is well-controlled.  Range of motion is well-preserved in the finger.  Pertinent ROS were reviewed with the patient and found to be negative unless otherwise specified above in HPI.   Assessment & Plan: Visit Diagnoses: No diagnosis found.  Plan: She has done well postoperatively.  Pathology was reviewed today which shows hemangioma versus possible pyogenic granuloma.  We discussed the underlying nature of these growths and the possibility for recurrence.  At this juncture, she can do activities as tolerated without formalized restrictions.  Given her excellent range of motion, I do not see any need for postoperative therapy.  She is welcome return in approximate 4 weeks for recheck or on as-needed basis.  I did explain that she can call our office to update [place] if she is doing well to save her the trouble of a return visit.  Follow-up: No follow-ups on file.   Meds & Orders: No orders of the defined types were placed in this encounter.  No orders of the defined types were placed in this encounter.    Procedures: No procedures performed       Objective:   Vital Signs: There were no vitals taken for this visit.  Ortho Exam Right ring finger: - Well-healed Bruner style incision - Full digital range of motion, composite fist without restriction - Sutures removed today, skin is well-approximated without erythema or drainage - Sensation intact distally throughout the finger, capillary refill normal  Imaging: No results found.   Rosana Farnell Trevor Mace, M.D. [facility] 8:08 AM

## 2024-02-12 ENCOUNTER — Other Ambulatory Visit: Payer: Self-pay

## 2024-02-13 ENCOUNTER — Other Ambulatory Visit: Payer: Self-pay

## 2024-02-13 MED ORDER — TRAZODONE HCL 100 MG PO TABS
100.0000 mg | ORAL_TABLET | Freq: Every evening | ORAL | 0 refills | Status: AC | PRN
  Filled 2024-02-13: qty 180, 90d supply, fill #0

## 2024-02-13 MED ORDER — HYDROXYZINE PAMOATE 50 MG PO CAPS
50.0000 mg | ORAL_CAPSULE | Freq: Every day | ORAL | 0 refills | Status: AC | PRN
  Filled 2024-02-13: qty 90, 90d supply, fill #0

## 2024-02-13 MED ORDER — LURASIDONE HCL 80 MG PO TABS
80.0000 mg | ORAL_TABLET | Freq: Every day | ORAL | 0 refills | Status: AC
Start: 2024-02-13 — End: ?
  Filled 2024-02-13: qty 90, 90d supply, fill #0

## 2024-02-13 MED ORDER — CYCLOBENZAPRINE HCL 5 MG PO TABS
5.0000 mg | ORAL_TABLET | Freq: Every evening | ORAL | 0 refills | Status: DC | PRN
Start: 2024-02-13 — End: 2024-08-04
  Filled 2024-02-13: qty 30, 30d supply, fill #0

## 2024-02-19 ENCOUNTER — Other Ambulatory Visit: Payer: Self-pay

## 2024-02-24 ENCOUNTER — Other Ambulatory Visit: Payer: Self-pay

## 2024-02-24 MED ORDER — ERYTHROMYCIN 5 MG/GM OP OINT
TOPICAL_OINTMENT | Freq: Three times a day (TID) | OPHTHALMIC | 0 refills | Status: AC
Start: 2024-02-24 — End: ?
  Filled 2024-02-24 – 2024-03-14 (×2): qty 3.5, 10d supply, fill #0

## 2024-03-03 ENCOUNTER — Ambulatory Visit (INDEPENDENT_AMBULATORY_CARE_PROVIDER_SITE_OTHER): Payer: BC Managed Care – PPO | Admitting: Dermatology

## 2024-03-03 ENCOUNTER — Encounter: Payer: Self-pay | Admitting: Dermatology

## 2024-03-03 DIAGNOSIS — D225 Melanocytic nevi of trunk: Secondary | ICD-10-CM

## 2024-03-03 DIAGNOSIS — D2310 Other benign neoplasm of skin of unspecified eyelid, including canthus: Secondary | ICD-10-CM

## 2024-03-03 DIAGNOSIS — D229 Melanocytic nevi, unspecified: Secondary | ICD-10-CM

## 2024-03-03 DIAGNOSIS — D23122 Other benign neoplasm of skin of left lower eyelid, including canthus: Secondary | ICD-10-CM | POA: Diagnosis not present

## 2024-03-03 NOTE — Patient Instructions (Signed)

## 2024-03-03 NOTE — Progress Notes (Signed)
   Follow-Up Visit   Subjective  Monica Dominguez is a 48 y.o. female who presents for the following: place at lower L eye came up last month, continues to grow, not bothersome, not painful, not itchy, not draining. Pt did see PCP and was being treated for Hordeolum externum but got concerned when spot continued getting bigger.  The patient has spots, moles and lesions to be evaluated, some may be new or changing and the patient may have concern these could be cancer.   The following portions of the chart were reviewed this encounter and updated as appropriate: medications, allergies, medical history  Review of Systems:  No other skin or systemic complaints except as noted in HPI or Assessment and Plan.  Objective  Well appearing patient in no apparent distress; mood and affect are within normal limits.   A focused examination was performed of the following areas: Face, neck, R breast  Relevant exam findings are noted in the Assessment and Plan.  L infraorbital Translucent grayish papule on left lower medial eyelid near the margin  Assessment & Plan   MELANOCYTIC NEVI Exam: Tan-brown papules at R breast  Treatment Plan: Benign appearing on exam today. Recommend observation. Call clinic for new or changing moles. Recommend daily use of broad spectrum spf 30+ sunscreen to sun-exposed areas.     MULTIPLE BENIGN NEVI   HIDROCYSTOMA OF EYELID, LEFT L infraorbital Benign-appearing. Recommend contacting ophthalmologist for evaluation due to location close to eye. Patient advised to call us back if needed.    Return if symptoms worsen or fail to improve, for w/ Dr. Katrinka Blazing.  Monica Dominguez, CMA, am acting as scribe for Elie Goody, MD .   Documentation: I have reviewed the above documentation for accuracy and completeness, and I agree with the above.  Elie Goody, MD

## 2024-03-06 ENCOUNTER — Other Ambulatory Visit: Payer: Self-pay

## 2024-03-14 ENCOUNTER — Other Ambulatory Visit (HOSPITAL_BASED_OUTPATIENT_CLINIC_OR_DEPARTMENT_OTHER): Payer: Self-pay

## 2024-03-15 ENCOUNTER — Other Ambulatory Visit: Payer: Self-pay

## 2024-03-17 ENCOUNTER — Other Ambulatory Visit: Payer: Self-pay

## 2024-03-17 ENCOUNTER — Emergency Department

## 2024-03-17 ENCOUNTER — Observation Stay: Admitting: Anesthesiology

## 2024-03-17 ENCOUNTER — Encounter
Admission: EM | Disposition: A | Discharge status: Home / Self Care | Payer: Self-pay | Source: Home / Self Care | Attending: Emergency Medicine

## 2024-03-17 ENCOUNTER — Observation Stay
Admission: EM | Admit: 2024-03-17 | Discharge: 2024-03-17 | Disposition: A | Discharge status: Home / Self Care | Attending: General Surgery | Admitting: General Surgery

## 2024-03-17 DIAGNOSIS — K3580 Unspecified acute appendicitis: Secondary | ICD-10-CM

## 2024-03-17 DIAGNOSIS — Z8541 Personal history of malignant neoplasm of cervix uteri: Secondary | ICD-10-CM | POA: Insufficient documentation

## 2024-03-17 DIAGNOSIS — R1031 Right lower quadrant pain: Secondary | ICD-10-CM | POA: Diagnosis present

## 2024-03-17 DIAGNOSIS — F17210 Nicotine dependence, cigarettes, uncomplicated: Secondary | ICD-10-CM | POA: Insufficient documentation

## 2024-03-17 DIAGNOSIS — E119 Type 2 diabetes mellitus without complications: Secondary | ICD-10-CM | POA: Insufficient documentation

## 2024-03-17 DIAGNOSIS — Z79899 Other long term (current) drug therapy: Secondary | ICD-10-CM | POA: Insufficient documentation

## 2024-03-17 DIAGNOSIS — K353 Acute appendicitis with localized peritonitis, without perforation or gangrene: Principal | ICD-10-CM | POA: Diagnosis present

## 2024-03-17 DIAGNOSIS — Z91040 Latex allergy status: Secondary | ICD-10-CM | POA: Insufficient documentation

## 2024-03-17 HISTORY — PX: XI ROBOTIC LAPAROSCOPIC ASSISTED APPENDECTOMY: SHX6877

## 2024-03-17 LAB — COMPREHENSIVE METABOLIC PANEL
ALT: 17 U/L (ref 0–44)
AST: 16 U/L (ref 15–41)
Albumin: 4 g/dL (ref 3.5–5.0)
Alkaline Phosphatase: 84 U/L (ref 38–126)
Anion gap: 8 (ref 5–15)
BUN: 11 mg/dL (ref 6–20)
CO2: 21 mmol/L — ABNORMAL LOW (ref 22–32)
Calcium: 9.2 mg/dL (ref 8.9–10.3)
Chloride: 109 mmol/L (ref 98–111)
Creatinine, Ser: 0.9 mg/dL (ref 0.44–1.00)
GFR, Estimated: >60 mL/min (ref >60)
Glucose, Bld: 173 mg/dL — ABNORMAL HIGH (ref 70–99)
Potassium: 3.9 mmol/L (ref 3.5–5.1)
Sodium: 138 mmol/L (ref 135–145)
Total Bilirubin: 0.5 mg/dL (ref 0.0–1.2)
Total Protein: 7.2 g/dL (ref 6.5–8.1)

## 2024-03-17 LAB — LIPASE, BLOOD: Lipase: 50 U/L (ref 11–51)

## 2024-03-17 LAB — CBC
HCT: 46.3 % — ABNORMAL HIGH (ref 36.0–46.0)
Hemoglobin: 15.8 g/dL — ABNORMAL HIGH (ref 12.0–15.0)
MCH: 30.5 pg (ref 26.0–34.0)
MCHC: 34.1 g/dL (ref 30.0–36.0)
MCV: 89.4 fL (ref 80.0–100.0)
Platelets: 250 10*3/uL (ref 150–400)
RBC: 5.18 MIL/uL — ABNORMAL HIGH (ref 3.87–5.11)
RDW: 13.3 % (ref 11.5–15.5)
WBC: 14.9 10*3/uL — ABNORMAL HIGH (ref 4.0–10.5)
nRBC: 0 % (ref 0.0–0.2)

## 2024-03-17 LAB — CBG MONITORING, ED
Glucose-Capillary: 72 mg/dL (ref 70–99)
Glucose-Capillary: 99 mg/dL (ref 70–99)

## 2024-03-17 LAB — HCG, QUANTITATIVE, PREGNANCY: hCG, Beta Chain, Quant, S: 3 m[IU]/mL (ref <5)

## 2024-03-17 LAB — HIV ANTIBODY (ROUTINE TESTING W REFLEX): HIV Screen 4th Generation wRfx: NONREACTIVE

## 2024-03-17 SURGERY — APPENDECTOMY, ROBOT-ASSISTED, LAPAROSCOPIC
Anesthesia: General

## 2024-03-17 MED ORDER — FENTANYL CITRATE (PF) 100 MCG/2ML IJ SOLN
INTRAMUSCULAR | Status: AC
  Filled 2024-03-17: qty 2

## 2024-03-17 MED ORDER — DEXAMETHASONE SODIUM PHOSPHATE 10 MG/ML IJ SOLN
INTRAMUSCULAR | Status: DC | PRN
  Administered 2024-03-17: 10 mg via INTRAVENOUS

## 2024-03-17 MED ORDER — ONDANSETRON HCL 4 MG/2ML IJ SOLN
INTRAMUSCULAR | Status: DC | PRN
  Administered 2024-03-17: 4 mg via INTRAVENOUS

## 2024-03-17 MED ORDER — FENTANYL CITRATE (PF) 100 MCG/2ML IJ SOLN
INTRAMUSCULAR | Status: DC | PRN
  Administered 2024-03-17 (×2): 50 ug via INTRAVENOUS

## 2024-03-17 MED ORDER — ROCURONIUM BROMIDE 100 MG/10ML IV SOLN
INTRAVENOUS | Status: DC | PRN
  Administered 2024-03-17: 50 mg via INTRAVENOUS

## 2024-03-17 MED ORDER — MIDAZOLAM HCL 2 MG/2ML IJ SOLN
INTRAMUSCULAR | Status: AC
  Filled 2024-03-17: qty 2

## 2024-03-17 MED ORDER — DEXMEDETOMIDINE HCL IN NACL 80 MCG/20ML IV SOLN
INTRAVENOUS | Status: DC | PRN
  Administered 2024-03-17 (×2): 8 ug via INTRAVENOUS

## 2024-03-17 MED ORDER — TRAZODONE HCL 50 MG PO TABS: 150.0000 mg | ORAL_TABLET | Freq: Every evening | ORAL | Status: DC | PRN

## 2024-03-17 MED ORDER — LIDOCAINE HCL (CARDIAC) PF 100 MG/5ML IV SOSY
PREFILLED_SYRINGE | INTRAVENOUS | Status: DC | PRN
  Administered 2024-03-17: 60 mg via INTRAVENOUS

## 2024-03-17 MED ORDER — PANTOPRAZOLE SODIUM 40 MG PO TBEC
40.0000 mg | DELAYED_RELEASE_TABLET | Freq: Every day | ORAL | Status: DC
  Administered 2024-03-17: 40 mg via ORAL
  Filled 2024-03-17: qty 1

## 2024-03-17 MED ORDER — LACTATED RINGERS IV SOLN: INTRAVENOUS | Status: DC | PRN

## 2024-03-17 MED ORDER — IOHEXOL 300 MG/ML  SOLN
100.0000 mL | Freq: Once | INTRAMUSCULAR | Status: AC | PRN
  Administered 2024-03-17: 100 mL via INTRAVENOUS

## 2024-03-17 MED ORDER — SUCCINYLCHOLINE CHLORIDE 200 MG/10ML IV SOSY
PREFILLED_SYRINGE | INTRAVENOUS | Status: DC | PRN
  Administered 2024-03-17: 120 mg via INTRAVENOUS

## 2024-03-17 MED ORDER — SODIUM CHLORIDE 0.9 % IV SOLN
2.0000 g | INTRAVENOUS | Status: DC
  Administered 2024-03-17: 2 g via INTRAVENOUS
  Filled 2024-03-17: qty 20

## 2024-03-17 MED ORDER — PHENYLEPHRINE 80 MCG/ML (10ML) SYRINGE FOR IV PUSH (FOR BLOOD PRESSURE SUPPORT)
PREFILLED_SYRINGE | INTRAVENOUS | Status: DC | PRN
  Administered 2024-03-17 (×2): 160 ug via INTRAVENOUS
  Administered 2024-03-17: 80 ug via INTRAVENOUS
  Administered 2024-03-17: 160 ug via INTRAVENOUS

## 2024-03-17 MED ORDER — ONDANSETRON HCL 4 MG/2ML IJ SOLN
INTRAMUSCULAR | Status: AC
  Filled 2024-03-17: qty 2

## 2024-03-17 MED ORDER — GLYCOPYRROLATE 0.2 MG/ML IJ SOLN
INTRAMUSCULAR | Status: DC | PRN
  Administered 2024-03-17: .2 mg via INTRAVENOUS

## 2024-03-17 MED ORDER — LACTATED RINGERS IV BOLUS
1000.0000 mL | Freq: Once | INTRAVENOUS | Status: AC
Start: 2024-03-17 — End: 2024-03-17
  Administered 2024-03-17: 1000 mL via INTRAVENOUS

## 2024-03-17 MED ORDER — 0.9 % SODIUM CHLORIDE (POUR BTL) OPTIME
TOPICAL | Status: DC | PRN
Start: 2024-03-17 — End: 2024-03-17
  Administered 2024-03-17: 500 mL

## 2024-03-17 MED ORDER — PHENYLEPHRINE 80 MCG/ML (10ML) SYRINGE FOR IV PUSH (FOR BLOOD PRESSURE SUPPORT)
PREFILLED_SYRINGE | INTRAVENOUS | Status: AC
  Filled 2024-03-17: qty 10

## 2024-03-17 MED ORDER — ONDANSETRON 4 MG PO TBDP: 4.0000 mg | ORAL_TABLET | Freq: Four times a day (QID) | ORAL | Status: DC | PRN

## 2024-03-17 MED ORDER — DEXMEDETOMIDINE HCL IN NACL 80 MCG/20ML IV SOLN
INTRAVENOUS | Status: AC
  Filled 2024-03-17: qty 20

## 2024-03-17 MED ORDER — KETOROLAC TROMETHAMINE 15 MG/ML IJ SOLN
15.0000 mg | Freq: Once | INTRAMUSCULAR | Status: AC
  Administered 2024-03-17: 15 mg via INTRAVENOUS
  Filled 2024-03-17: qty 1

## 2024-03-17 MED ORDER — LURASIDONE HCL 40 MG PO TABS: 80.0000 mg | ORAL_TABLET | Freq: Every day | ORAL | Status: DC

## 2024-03-17 MED ORDER — FENTANYL CITRATE (PF) 100 MCG/2ML IJ SOLN
25.0000 ug | INTRAMUSCULAR | Status: DC | PRN
Start: 2024-03-17 — End: 2024-03-17
  Administered 2024-03-17: 25 ug via INTRAVENOUS
  Administered 2024-03-17: 50 ug via INTRAVENOUS
  Administered 2024-03-17: 25 ug via INTRAVENOUS

## 2024-03-17 MED ORDER — LIDOCAINE HCL (PF) 2 % IJ SOLN
INTRAMUSCULAR | Status: AC
  Filled 2024-03-17: qty 5

## 2024-03-17 MED ORDER — ACETAMINOPHEN 325 MG PO TABS: 650.0000 mg | ORAL_TABLET | Freq: Four times a day (QID) | ORAL | Status: DC | PRN

## 2024-03-17 MED ORDER — PROPOFOL 10 MG/ML IV BOLUS
INTRAVENOUS | Status: AC
  Filled 2024-03-17: qty 20

## 2024-03-17 MED ORDER — ENOXAPARIN SODIUM 40 MG/0.4ML IJ SOSY
40.0000 mg | PREFILLED_SYRINGE | INTRAMUSCULAR | Status: DC
Start: 2024-03-18 — End: 2024-03-17

## 2024-03-17 MED ORDER — METRONIDAZOLE 500 MG/100ML IV SOLN
500.0000 mg | Freq: Two times a day (BID) | INTRAVENOUS | Status: DC
  Administered 2024-03-17: 500 mg via INTRAVENOUS
  Filled 2024-03-17: qty 100

## 2024-03-17 MED ORDER — OXYCODONE HCL 5 MG/5ML PO SOLN: 5.0000 mg | Freq: Once | ORAL | Status: DC | PRN

## 2024-03-17 MED ORDER — BUPIVACAINE-EPINEPHRINE (PF) 0.5% -1:200000 IJ SOLN
INTRAMUSCULAR | Status: AC
  Filled 2024-03-17: qty 30

## 2024-03-17 MED ORDER — SUCCINYLCHOLINE CHLORIDE 200 MG/10ML IV SOSY
PREFILLED_SYRINGE | INTRAVENOUS | Status: AC
  Filled 2024-03-17: qty 10

## 2024-03-17 MED ORDER — HYDROXYZINE HCL 50 MG PO TABS: 50.0000 mg | ORAL_TABLET | Freq: Every day | ORAL | Status: DC | PRN

## 2024-03-17 MED ORDER — OXYCODONE HCL 5 MG PO TABS: 5.0000 mg | ORAL_TABLET | Freq: Once | ORAL | Status: DC | PRN

## 2024-03-17 MED ORDER — ONDANSETRON HCL 4 MG/2ML IJ SOLN: 4.0000 mg | Freq: Four times a day (QID) | INTRAMUSCULAR | Status: DC | PRN

## 2024-03-17 MED ORDER — SUGAMMADEX SODIUM 200 MG/2ML IV SOLN
INTRAVENOUS | Status: AC
  Filled 2024-03-17: qty 2

## 2024-03-17 MED ORDER — HYDROCODONE-ACETAMINOPHEN 5-325 MG PO TABS
ORAL_TABLET | ORAL | Status: AC
  Filled 2024-03-17: qty 2

## 2024-03-17 MED ORDER — MIDAZOLAM HCL 2 MG/2ML IJ SOLN
INTRAMUSCULAR | Status: DC | PRN
  Administered 2024-03-17: 2 mg via INTRAVENOUS

## 2024-03-17 MED ORDER — PROPOFOL 1000 MG/100ML IV EMUL
INTRAVENOUS | Status: AC
  Filled 2024-03-17: qty 100

## 2024-03-17 MED ORDER — HYDROCODONE-ACETAMINOPHEN 5-325 MG PO TABS
1.0000 | ORAL_TABLET | Freq: Four times a day (QID) | ORAL | 0 refills | Status: AC | PRN
  Filled 2024-03-17: qty 12, 3d supply, fill #0

## 2024-03-17 MED ORDER — METRONIDAZOLE 500 MG/100ML IV SOLN: 500.0000 mg | Freq: Once | INTRAVENOUS | Status: DC

## 2024-03-17 MED ORDER — BUPIVACAINE-EPINEPHRINE (PF) 0.5% -1:200000 IJ SOLN
INTRAMUSCULAR | Status: DC | PRN
  Administered 2024-03-17: 30 mL

## 2024-03-17 MED ORDER — GLYCOPYRROLATE 0.2 MG/ML IJ SOLN
INTRAMUSCULAR | Status: AC
  Filled 2024-03-17: qty 1

## 2024-03-17 MED ORDER — ACETAMINOPHEN 325 MG RE SUPP: 650.0000 mg | Freq: Four times a day (QID) | RECTAL | Status: DC | PRN

## 2024-03-17 MED ORDER — ROCURONIUM BROMIDE 10 MG/ML (PF) SYRINGE
PREFILLED_SYRINGE | INTRAVENOUS | Status: AC
  Filled 2024-03-17: qty 10

## 2024-03-17 MED ORDER — SUGAMMADEX SODIUM 200 MG/2ML IV SOLN
INTRAVENOUS | Status: DC | PRN
  Administered 2024-03-17: 250 mg via INTRAVENOUS

## 2024-03-17 MED ORDER — MORPHINE SULFATE (PF) 4 MG/ML IV SOLN
4.0000 mg | INTRAVENOUS | Status: DC | PRN
  Administered 2024-03-17: 4 mg via INTRAVENOUS
  Filled 2024-03-17: qty 1

## 2024-03-17 MED ORDER — HYDROCODONE-ACETAMINOPHEN 5-325 MG PO TABS
1.0000 | ORAL_TABLET | ORAL | Status: DC | PRN
  Administered 2024-03-17: 2 via ORAL

## 2024-03-17 MED ORDER — DEXAMETHASONE SODIUM PHOSPHATE 10 MG/ML IJ SOLN
INTRAMUSCULAR | Status: AC
  Filled 2024-03-17: qty 1

## 2024-03-17 MED ORDER — PROPOFOL 10 MG/ML IV BOLUS
INTRAVENOUS | Status: DC | PRN
  Administered 2024-03-17: 130 ug/kg/min via INTRAVENOUS
  Administered 2024-03-17: 150 mg via INTRAVENOUS

## 2024-03-17 MED ORDER — SODIUM CHLORIDE 0.9 % IV SOLN: 1.0000 g | Freq: Once | INTRAVENOUS | Status: DC

## 2024-03-17 SURGICAL SUPPLY — 55 items
BAG PRESSURE INF REUSE 1000 (BAG) IMPLANT
CANNULA REDUCER 12-8 DVNC XI (CANNULA) ×1 IMPLANT
COVER TIP SHEARS 8 DVNC (MISCELLANEOUS) ×1 IMPLANT
DERMABOND ADVANCED .7 DNX12 (GAUZE/BANDAGES/DRESSINGS) ×1 IMPLANT
DRAPE ARM DVNC X/XI (DISPOSABLE) ×4 IMPLANT
DRAPE COLUMN DVNC XI (DISPOSABLE) ×1 IMPLANT
ELECT REM PT RETURN 9FT ADLT (ELECTROSURGICAL) ×1 IMPLANT
ELECTRODE REM PT RTRN 9FT ADLT (ELECTROSURGICAL) ×1 IMPLANT
FORCEPS BPLR FENES DVNC XI (FORCEP) ×1 IMPLANT
GLOVE BIOGEL PI IND STRL 6.5 (GLOVE) ×2 IMPLANT
GLOVE SURG SYN 6.5 ES PF (GLOVE) ×2 IMPLANT
GLOVE SURG SYN 6.5 PF PI (GLOVE) ×2 IMPLANT
GOWN STRL REUS W/ TWL LRG LVL3 (GOWN DISPOSABLE) ×3 IMPLANT
GRASPER SUT TROCAR 14GX15 (MISCELLANEOUS) IMPLANT
GRASPER TIP-UP FEN DVNC XI (INSTRUMENTS) ×1 IMPLANT
IRRIGATOR SUCT 8 DISP DVNC XI (IRRIGATION / IRRIGATOR) IMPLANT
IV NS 1000ML BAXH (IV SOLUTION) IMPLANT
KIT PINK PAD W/HEAD ARE REST (MISCELLANEOUS) ×1 IMPLANT
KIT PINK PAD W/HEAD ARM REST (MISCELLANEOUS) ×1 IMPLANT
LABEL OR SOLS (LABEL) IMPLANT
MANIFOLD NEPTUNE II (INSTRUMENTS) ×1 IMPLANT
NDL DRIVE SUT CUT DVNC (INSTRUMENTS) ×1 IMPLANT
NDL HYPO 22X1.5 SAFETY MO (MISCELLANEOUS) ×1 IMPLANT
NDL INSUFFLATION 14GA 120MM (NEEDLE) ×1 IMPLANT
NEEDLE DRIVE SUT CUT DVNC (INSTRUMENTS) ×1 IMPLANT
NEEDLE HYPO 22X1.5 SAFETY MO (MISCELLANEOUS) ×1 IMPLANT
NEEDLE INSUFFLATION 14GA 120MM (NEEDLE) ×1 IMPLANT
OBTURATOR OPTICAL STND 8 DVNC (TROCAR) ×1 IMPLANT
OBTURATOR OPTICALSTD 8 DVNC (TROCAR) ×1 IMPLANT
PACK LAP CHOLECYSTECTOMY (MISCELLANEOUS) ×1 IMPLANT
RELOAD STAPLE 45 2.5 WHT DVNC (STAPLE) IMPLANT
RELOAD STAPLE 45 3.5 BLU DVNC (STAPLE) IMPLANT
RELOAD STAPLER 2.5X45 WHT DVNC (STAPLE) IMPLANT
RELOAD STAPLER 3.5X45 BLU DVNC (STAPLE) IMPLANT
SCISSORS MNPLR CVD DVNC XI (INSTRUMENTS) ×1 IMPLANT
SEAL UNIV 5-12 XI (MISCELLANEOUS) ×4 IMPLANT
SEALER VESSEL EXT DVNC XI (MISCELLANEOUS) IMPLANT
SET TUBE SMOKE EVAC HIGH FLOW (TUBING) ×1 IMPLANT
SOL ELECTROSURG ANTI STICK (MISCELLANEOUS) ×1 IMPLANT
SOLUTION ELECTROSURG ANTI STCK (MISCELLANEOUS) ×1 IMPLANT
SPONGE T-LAP 4X18 ~~LOC~~+RFID (SPONGE) ×1 IMPLANT
STAPLER 45 SUREFORM DVNC (STAPLE) IMPLANT
STAPLER RELOAD 2.5X45 WHT DVNC (STAPLE) IMPLANT
STAPLER RELOAD 3.5X45 BLU DVNC (STAPLE) IMPLANT
SUT MNCRL AB 4-0 PS2 18 (SUTURE) ×1 IMPLANT
SUT STRATA 2-0 23CM CT-2 (SUTURE) IMPLANT
SUT STRATA 3-0 15 RB-1.5 (SUTURE) ×1 IMPLANT
SUT STRATAFIX PDS 2-0 CT-2 (SUTURE) IMPLANT
SUT VICRYL 0 UR6 27IN ABS (SUTURE) ×1 IMPLANT
SYR 30ML LL (SYRINGE) ×1 IMPLANT
SYS BAG RETRIEVAL 10MM (BASKET) ×1 IMPLANT
SYSTEM BAG RETRIEVAL 10MM (BASKET) ×1 IMPLANT
TRAP FLUID SMOKE EVACUATOR (MISCELLANEOUS) ×1 IMPLANT
TRAY FOLEY MTR SLVR 16FR STAT (SET/KITS/TRAYS/PACK) IMPLANT
WATER STERILE IRR 500ML POUR (IV SOLUTION) ×1 IMPLANT

## 2024-03-17 NOTE — Anesthesia Preprocedure Evaluation (Signed)
 Anesthesia Evaluation  Patient identified by MRN, date of birth, ID band Patient awake    Reviewed: Allergy & Precautions, NPO status , Patient's Chart, lab work & pertinent test results  History of Anesthesia Complications Negative for: history of anesthetic complications  Airway Mallampati: II  TM Distance: >3 FB Neck ROM: full    Dental  (+) Poor Dentition, Missing, Chipped   Pulmonary neg shortness of breath, sleep apnea , Current Smoker   Pulmonary exam normal        Cardiovascular Exercise Tolerance: Good (-) angina (-) Past MI negative cardio ROS Normal cardiovascular exam     Neuro/Psych  Neuromuscular disease  negative psych ROS   GI/Hepatic Neg liver ROS,GERD  Controlled,,  Endo/Other  diabetes, Type 2  Class 3 obesity  Renal/GU      Musculoskeletal   Abdominal   Peds  Hematology negative hematology ROS (+)   Anesthesia Other Findings Past Medical History: No date: Anxiety No date: Arthritis No date: Bipolar 2 disorder (HCC) No date: Bipolar disorder (HCC) No date: Cervical cancer (HCC) No date: CTS (carpal tunnel syndrome) No date: CTS (carpal tunnel syndrome) No date: Diabetes mellitus without complication (HCC) No date: Environmental allergies No date: GERD (gastroesophageal reflux disease) No date: History of kidney stones No date: Hyperlipemia No date: Obesity No date: Pneumonia No date: Sleep apnea     Comment:  cpap  Past Surgical History: No date: ABDOMINAL HYSTERECTOMY 04/03/2013: CARPAL TUNNEL RELEASE; Right 11/18/2017: COLONOSCOPY WITH PROPOFOL; N/A     Comment:  Procedure: COLONOSCOPY WITH PROPOFOL;  Surgeon: Scot Jun, MD;  Location: [facility] ENDOSCOPY;  Service:               Endoscopy;  Laterality: N/A; 05/31/2023: COLONOSCOPY WITH PROPOFOL; N/A     Comment:  Procedure: COLONOSCOPY WITH PROPOFOL;  Surgeon: [name], DO;  Location:  [facility] ENDOSCOPY;  Service:               Gastroenterology;  Laterality: N/A; No date: DILATION AND CURETTAGE OF UTERUS 07/13/2016: ESOPHAGOGASTRODUODENOSCOPY (EGD) WITH PROPOFOL; N/A     Comment:  Procedure: ESOPHAGOGASTRODUODENOSCOPY (EGD) WITH               PROPOFOL;  Surgeon: Scot Jun, MD;  Location: [facility]              ENDOSCOPY;  Service: Endoscopy;  Laterality: N/A; 11/09/2016: HEMORRHOID SURGERY; N/A     Comment:  Procedure: HEMORRHOIDECTOMY;  Surgeon: Nadeen Landau, MD;  Location: [facility] ORS;  Service: General;                Laterality: N/A; 01/24/2023: SHOULDER ARTHROSCOPY WITH SUBACROMIAL DECOMPRESSION,  ROTATOR CUFF REPAIR AND BICEP TENDON REPAIR; Right     Comment:  Procedure: SHOULDER ARTHROSCOPY WITH DEBRIDEMENT,               DECOMPRESSION, EXCISION OF DISTAL CLAVICLE;  Surgeon:               Christena Flake, MD;  Location: [facility] ORS;  Service:               Orthopedics;  Laterality: Right; No date: TONSILLECTOMY No date: WISDOM TOOTH EXTRACTION  BMI    Body Mass Index: 43.53 kg/m  Reproductive/Obstetrics negative OB ROS                             Anesthesia Physical Anesthesia Plan  ASA: 3  Anesthesia Plan: General ETT   Post-op Pain Management:    Induction: Intravenous  PONV Risk Score and Plan: Ondansetron, Dexamethasone, Midazolam and Treatment may vary due to age or medical condition  Airway Management Planned: Oral ETT  Additional Equipment:   Intra-op Plan:   Post-operative Plan: Extubation in OR  Informed Consent: I have reviewed the patients History and Physical, chart, labs and discussed the procedure including the risks, benefits and alternatives for the proposed anesthesia with the patient or authorized representative who has indicated his/her understanding and acceptance.     Dental Advisory Given  Plan Discussed with: Anesthesiologist, CRNA and Surgeon  Anesthesia Plan Comments:  (Patient consented for risks of anesthesia including but not limited to:  - adverse reactions to medications - damage to eyes, teeth, lips or other oral mucosa - nerve damage due to positioning  - sore throat or hoarseness - Damage to heart, brain, nerves, lungs, other parts of body or loss of life  Patient voiced understanding and assent.)       Anesthesia Quick Evaluation

## 2024-03-17 NOTE — Discharge Instructions (Addendum)

## 2024-03-17 NOTE — ED Provider Notes (Signed)
 [facility] Provider Note    Event Date/Time   First MD Initiated Contact with Patient 03/17/24 [phone]     (approximate)   History   Abdominal Pain   HPI  Monica Dominguez is a 48 y.o. female with history of anxiety, bipolar disorder, GERD, diabetes, prior history of cervical cancer status post hysterectomy, prior history kidney stones, presenting with right lower quadrant abdominal pain.  States that happened suddenly at midnight.  Been constant.  Not associated with any nausea, vomiting, diarrhea.  Has been having normal bowel movements.  No flank pain or back pain, no urinary symptoms.  Patient states that her ovaries were also taken out with her hysterectomy.  On independent chart review, she had her hysterectomy done in 2005 with cervix removal.  Was seen by primary care doctor in November for annual exam, has history of diabetes, was on metformin but that was discontinued due to GI side effects, she was switched to [facility].     Physical Exam   Triage Vital Signs: ED Triage Vitals  Encounter Vitals Group     BP 03/17/24 0631 (!) 170/79     Systolic BP Percentile --      Diastolic BP Percentile --      Pulse Rate 03/17/24 0631 (!) 110     Resp 03/17/24 0631 20     Temp --      Temp src --      SpO2 03/17/24 0631 96 %     Weight 03/17/24 0630 238 lb (108 kg)     Height 03/17/24 0630 5\' 2"  (1.575 m)     Head Circumference --      Peak Flow --      Pain Score 03/17/24 0630 7     Pain Loc --      Pain Education --      Exclude from Growth Chart --     Most recent vital signs: Vitals:   03/17/24 0830 03/17/24 0900  BP: 122/83 128/88  Pulse: 82 77  Resp: 18 18  SpO2: 99% 100%     General: Awake, no distress.  CV:  Good peripheral perfusion.  Resp:  Normal effort.  Abd:  No distention.  Soft, tender to right lower quadrant, no guarding or rebound Other:  No rash to her lower abdomen flank and back.  No CVA tenderness, no flank  tenderness.   ED Results / Procedures / Treatments   Labs (all labs ordered are listed, but only abnormal results are displayed) Labs Reviewed  COMPREHENSIVE METABOLIC PANEL - Abnormal; Notable for the following components:      Result Value   CO2 21 (*)    Glucose, Bld 173 (*)    All other components within normal limits  CBC - Abnormal; Notable for the following components:   WBC 14.9 (*)    RBC 5.18 (*)    Hemoglobin 15.8 (*)    HCT 46.3 (*)    All other components within normal limits  LIPASE, BLOOD  HCG, QUANTITATIVE, PREGNANCY  HIV ANTIBODY (ROUTINE TESTING W REFLEX)    RADIOLOGY CT abdomen pelvis on my interpretation showed enhanced possible appendicolith in the appendix.   PROCEDURES:  Critical Care performed: No  Procedures   MEDICATIONS ORDERED IN ED: Medications  hydrOXYzine (ATARAX) tablet 50 mg (has no administration in time range)  lurasidone (LATUDA) tablet 80 mg (has no administration in time range)  traZODone (DESYREL) tablet 150 mg (has no administration in  time range)  pantoprazole (PROTONIX) EC tablet 40 mg (has no administration in time range)  enoxaparin (LOVENOX) injection 40 mg (has no administration in time range)  cefTRIAXone (ROCEPHIN) 2 g in sodium chloride 0.9 % 100 mL IVPB (has no administration in time range)    And  metroNIDAZOLE (FLAGYL) IVPB 500 mg (has no administration in time range)  acetaminophen (TYLENOL) tablet 650 mg (has no administration in time range)    Or  acetaminophen (TYLENOL) suppository 650 mg (has no administration in time range)  HYDROcodone-acetaminophen (NORCO/VICODIN) 5-325 MG per tablet 1-2 tablet (has no administration in time range)  morphine (PF) 4 MG/ML injection 4 mg (has no administration in time range)  ondansetron (ZOFRAN-ODT) disintegrating tablet 4 mg (has no administration in time range)    Or  ondansetron (ZOFRAN) injection 4 mg (has no administration in time range)  iohexol (OMNIPAQUE) 300 MG/ML  solution 100 mL (100 mLs Intravenous Contrast Given 03/17/24 0813)  lactated ringers bolus 1,000 mL (1,000 mLs Intravenous New Bag/Given 03/17/24 8295)  ketorolac (TORADOL) 15 MG/ML injection 15 mg (15 mg Intravenous Given 03/17/24 0808)     IMPRESSION / MDM / ASSESSMENT AND PLAN / ED COURSE  I reviewed the triage vital signs and the nursing notes.                              Differential diagnosis includes, but is not limited to, appendicitis, nephrolithiasis, diverticulitis, colitis, MSK pain.  Get labs, CT abdomen pelvis, UA, IV fluids, pain meds.  Patient's presentation is most consistent with acute presentation with potential threat to life or bodily function.  Independent review of CT imaging as well as labs are below.  Surgery was consulted for acute pain ascites.  Ordered IV ceftriaxone and Flagyl.  Surgery admitted her for further management.  Discussed with patient about the results and laboratory findings.  She is agreeable plan for admission.  Clinical Course as of 03/17/24 1002  Tue Mar 17, 2024  0819 Independent review of labs, ECG is negative, electrolytes not severely deranged, LFTs are normal, lipase is normal, she does have a leukocytosis. [TT]  [id number] CT ABDOMEN PELVIS W CONTRAST IMPRESSION: 1. The appendix is dilated to 1.1 cm, with high density material and gas scattered within the appendiceal lumen but no overt wall thickening in the appendix. This high density material may be from inspissated barium products (favored) or appendicolith. Subtle stranding in the adipose tissue adjacent to the proximal portion of the appendix. Although the normal wall thickness is not typical for acute appendicitis, the faint proximal periappendiceal stranding and dilated appearance of the appendix along with proximal possible appendicolith and the patient's new right lower quadrant pain are moderately suspicious of early acute appendicitis. 2. 2.1 by 1.7 cm right adrenal mass, internal  density 17 Hounsfield units which is indeterminate. Previously there is only subtle nodularity in this vicinity. Although quite possibly due to a benign adenoma, adrenal protocol CT or MRI with and without contrast is recommended for definitive characterization. 3. Bilateral nonobstructive nephrolithiasis. 4. Mild degenerative hip arthropathy, right greater than left.   [TT]  [id number] Consulted surgery who will evaluate the patient. [TT]    Clinical Course User Index [TT] Jodie Echevaria Franchot Erichsen, MD     FINAL CLINICAL IMPRESSION(S) / ED DIAGNOSES   Final diagnoses:  Right lower quadrant abdominal pain  Acute appendicitis, unspecified acute appendicitis type     Rx / DC Orders  ED Discharge Orders     None        Note:  This document was prepared using Dragon voice recognition software and may include unintentional dictation errors.    Claybon Jabs, MD 03/17/24 [phone]

## 2024-03-17 NOTE — Anesthesia Postprocedure Evaluation (Signed)
 Anesthesia Post Note  Patient: Monica Dominguez  Procedure(s) Performed: APPENDECTOMY, ROBOT-ASSISTED, LAPAROSCOPIC  Patient location during evaluation: PACU Anesthesia Type: General Level of consciousness: awake and alert Pain management: pain level controlled Vital Signs Assessment: post-procedure vital signs reviewed and stable Respiratory status: spontaneous breathing, nonlabored ventilation, respiratory function stable and patient connected to nasal cannula oxygen Cardiovascular status: blood pressure returned to baseline and stable Postop Assessment: no apparent nausea or vomiting Anesthetic complications: no   No notable events documented.   Last Vitals:  Vitals:   03/17/24 1430 03/17/24 1445  BP: (!) 131/98   Pulse: 94 98  Resp: (!) 24 (!) 23  Temp:  (!) 36.1 C  SpO2: 97% 99%    Last Pain:  Vitals:   03/17/24 1430  TempSrc:   PainSc: 5                  Cleda Mccreedy Mihcael Ledee

## 2024-03-17 NOTE — Op Note (Signed)
 Pre-op Diagnosis: Acute appendicitis   Post op Diagnosis: Acute appenditicis  Procedure: Robotic assisted laparoscopic appendectomy.  Anesthesia: GETA  Surgeon: Carolan Shiver, MD, FACS  Wound Classification: clean contaminated  Specimen: Appendix  Complications: None  Estimated Blood Loss: 3 mL   Indications: Patient is a 48 y.o. female  presented with above right lower quadrant pain. CT scan shows acute appendicitis.     FIndings: 1.  Irritated appendix with appendicolith 2. No peri-appendiceal abscess or phlegmon 3. Normal anatomy 4. Adequate hemostasis.   Description of procedure: The patient was placed on the operating table in the supine position. General anesthesia was induced. A time-out was completed verifying correct patient, procedure, site, positioning, and implant(s) and/or special equipment prior to beginning this procedure. The abdomen was prepped and draped in the usual sterile fashion.   Palmer's point located and Veress needle was inserted.  After confirming 2 clicks and a positive saline drop test, gas insufflation was initiated until the abdominal pressure was measured at 15 mmHg.  Afterwards, the Veress needle was removed and a 8 mm port was placed in left upper quadrant area using Optiview technique.  After local was infused, 3 additional incision on the left abdominal wall were made 5 cm apart.  An 12 mm port and two other 8 mm ports were placed under direct visualization.  No injuries from trocar placements were noted.  The table was placed in the Trendelenburg position with the right side elevated.  With the use of Tip up grasper, fenestrated bipolar and monopolar scissors, an inflamed appendix was identified and elevated.  Window created at base of appendix in the mesentery.    The mesoappendix was divided with combination of bipolar energy and monopolar scissors.  The base of the appendix was ligated with 3-0 V-Loc.  The appendix was divided with  monopolar scissors.  A second layer of the 3 oh V-Loc was done over the appendiceal stump.   The appendiceal stump was examined and hemostasis noted. No other pathology was identified within pelvis. The 12 mm trocar removed and port site closed with PMI using 0 vicryl under direct vision. Remaining trocars were removed under direct vision. No bleeding was noted.The abdomen was allowed to collapse.  All skin incisions then closed with subcuticular sutures Monocryl 4-0.  Wounds then dressed with dermabond.  The patient tolerated the procedure well, awakened from anesthesia and was taken to the postanesthesia care unit in satisfactory condition.  Sponge count and instrument count correct at the end of the procedure.

## 2024-03-17 NOTE — ED Notes (Addendum)
 Pt taken to CT.

## 2024-03-17 NOTE — Anesthesia Procedure Notes (Signed)
 Procedure Name: Intubation Date/Time: 03/17/2024 12:26 PM  Performed by: Omer Jack, CRNAPre-anesthesia Checklist: Patient identified, Patient being monitored, Timeout performed, Emergency Drugs available and Suction available Patient Re-evaluated:Patient Re-evaluated prior to induction Oxygen Delivery Method: Circle system utilized Preoxygenation: Pre-oxygenation with 100% oxygen Induction Type: IV induction and Rapid sequence Ventilation: Mask ventilation without difficulty Laryngoscope Size: Mac and 3 Grade View: Grade II Tube type: Oral Tube size: 7.0 mm Number of attempts: 1 Placement Confirmation: ETT inserted through vocal cords under direct vision, positive ETCO2 and breath sounds checked- equal and bilateral Secured at: 21 cm Tube secured with: Tape Dental Injury: Teeth and Oropharynx as per pre-operative assessment

## 2024-03-17 NOTE — ED Triage Notes (Addendum)
 Pt reports lower right quadrant abd pain that began last night after passing gas, pt reports she had a kidney stone aprox 20 years ago. Pt denies n/v/d dysuria or vaginal bleeding. Pt states she still has her appendix and pain is worse with movement

## 2024-03-17 NOTE — ED Notes (Signed)
 Pt returned from CT

## 2024-03-17 NOTE — Discharge Summary (Signed)
 Patient ID: Monica Dominguez MRN: [id number] DOB/AGE: Jun 21, 1976 48 y.o.  Admit date: 03/17/2024 Discharge date: 03/17/2024   Discharge Diagnoses:  Principal Problem:   Acute appendicitis with localized peritonitis   Procedures: Robotic assisted laparoscopic appendectomy  Hospital Course: Patient admitted with acute appendicitis.  She underwent robotic appendectomy.  She has been recovering well.  She is tolerating diet.  The pain is controlled.  Incisions are dry and clean.  No sign of complications.  Physical Exam Vitals reviewed.  HENT:     Head: Normocephalic.  Cardiovascular:     Rate and Rhythm: Normal rate and regular rhythm.  Pulmonary:     Effort: Pulmonary effort is normal.  Abdominal:     General: Abdomen is flat. Bowel sounds are normal. There is no distension.     Palpations: Abdomen is soft.  Skin:    General: Skin is warm.  Neurological:     General: No focal deficit present.     Mental Status: She is alert.      Consults: None  Disposition: Discharge disposition: 01-Home or Self Care       Discharge Instructions     Diet - low sodium heart healthy   Complete by: As directed    Increase activity slowly   Complete by: As directed       Allergies as of 03/17/2024       Reactions   Tramadol Rash, Hives   And shortness of breath   Lamictal [lamotrigine] Rash   Latex Hives        Medication List     TAKE these medications    BIOTIN PO Take 1 capsule by mouth daily.   CALCIUM-MAGNESIUM-VITAMIN D PO Take 1 tablet by mouth daily.   Comirnaty syringe Generic drug: COVID-19 mRNA vaccine (Pf[employer]) Inject 0.3 mLs into the muscle.   cyclobenzaprine 5 MG tablet Commonly known as: FLEXERIL Take 1 tablet (5 mg total) by mouth at bedtime as needed.   erythromycin ophthalmic ointment Place into the left eye 3 (three) times daily for 10 days.   famotidine 20 MG tablet Commonly known as: PEPCID Take 1 tablet (20 mg total) by mouth 2  (two) times daily as needed for heartburn.   famotidine 20 MG tablet Commonly known as: PEPCID Take 1 tablet (20 mg total) by mouth daily.   FreeStyle Libre 2 Sensor Misc USE 1 KIT EVERY 14 (FOURTEEN) DAYS FOR GLUCOSE MONITORING (USE 1 KIT EVERY 14 (FOURTEEN) DAYS FOR GLUCOSE MONITORING)   FreeStyle Libre 2 Sensor Misc Apply a new sensor every 14 (fourteen) days for glucose monitoring   FreeStyle Libre 2 Sensor Misc Inject 1 kit subcutaneously every 14 (fourteen) days for glucose monitoring   HYDROcodone-acetaminophen 5-325 MG tablet Commonly known as: NORCO/VICODIN Take 1 tablet by mouth every 6 (six) hours as needed for up to 3 days.   hydrOXYzine 50 MG capsule Commonly known as: VISTARIL Take 1 capsule (50 mg total) by mouth daily as needed.   hydrOXYzine 50 MG capsule Commonly known as: VISTARIL Take 1 capsule (50 mg total) by mouth daily as needed.   latanoprost 0.005 % ophthalmic solution Commonly known as: XALATAN Instill 1 drop into affected eye(s) once daily in the evening   latanoprost 0.005 % ophthalmic solution Commonly known as: XALATAN Place 1 drop into affected eyes every evening.   latanoprost 0.005 % ophthalmic solution Commonly known as: XALATAN Place 1 drop into affected eye(s) every evening.   Linzess 145 MCG Caps capsule Generic drug: linaclotide  Take 1 capsule (145 mcg total) by mouth daily. Take 30 minutes before food.   lovastatin 20 MG tablet Commonly known as: MEVACOR Take 1 tablet (20 mg total) by mouth daily.   lurasidone 80 MG Tabs tablet Commonly known as: Latuda Take 1 tablet (80 mg total) by mouth daily with supper.   lurasidone 80 MG Tabs tablet Commonly known as: Latuda Take 1 tablet (80 mg total) by mouth daily with supper.   Magnesium Glycinate 100 MG Caps Take 100 mg by mouth at bedtime as needed.   Mounjaro 5 MG/0.5ML Pen Generic drug: tirzepatide Inject 5 mg into the skin once a week.   Mounjaro 5 MG/0.5ML  Pen Generic drug: tirzepatide Inject 5 mg into the skin once a week.   mupirocin ointment 2 % Commonly known as: BACTROBAN Apply 1 application topically to the affected area(s) once daily.   oxyCODONE 5 MG immediate release tablet Commonly known as: Roxicodone Take 1 tablet (5 mg total) by mouth every 6 (six) hours as needed for severe pain (pain score 7-10).   pantoprazole 40 MG tablet Commonly known as: PROTONIX Take 1 tablet (40 mg total) by mouth once daily   pantoprazole 40 MG tablet Commonly known as: PROTONIX Take 1 tablet (40 mg total) by mouth daily.   Prevnar 20 0.5 ML injection Generic drug: pneumococcal 20-valent conjugate vaccine Inject into the muscle.   traZODone 150 MG tablet Commonly known as: DESYREL Take 1 tablet (150 mg total) by mouth at bedtime.   traZODone 100 MG tablet Commonly known as: DESYREL Take 1-2 tablets (100-200 mg total) by mouth at bedtime as needed for sleep.   Vitamin D3 50 MCG (2000 UT) capsule Take 2,000 Units by mouth daily.        Follow-up Information     Carolan Shiver, MD Follow up on 03/31/2024.   Specialty: General Surgery Why: Follow up after appendectomy Tuesday 03/31/2024 at 8:30 am Contact information: [address] [place] [address] [phone]

## 2024-03-17 NOTE — ED Notes (Signed)
 Pt changing into hospital gown, clothing placed in belongings bag.

## 2024-03-17 NOTE — Transfer of Care (Signed)
 Immediate Anesthesia Transfer of Care Note  Patient: Monica Dominguez  Procedure(s) Performed: APPENDECTOMY, ROBOT-ASSISTED, LAPAROSCOPIC  Patient Location: PACU  Anesthesia Type:General  Level of Consciousness: drowsy and patient cooperative  Airway & Oxygen Therapy: Patient Spontanous Breathing and Patient connected to face mask oxygen  Post-op Assessment: Report given to RN and Post -op Vital signs reviewed and stable  Post vital signs: Reviewed and stable  Last Vitals:  Vitals Value Taken Time  BP 108/66 03/17/24 1330  Temp 36.1 C 03/17/24 1330  Pulse 100 03/17/24 1336  Resp 18 03/17/24 1336  SpO2 100 % 03/17/24 1336  Vitals shown include unfiled device data.  Last Pain:  Vitals:   03/17/24 1330  TempSrc:   PainSc: 0-No pain         Complications: No notable events documented.

## 2024-03-17 NOTE — H&P (Signed)
 SURGICAL CONSULTATION NOTE   HISTORY OF PRESENT ILLNESS (HPI):  48 y.o. female presented to [facility] ED for evaluation of abdominal pain. Patient reports she started having abdominal pain since midnight.  Pain started on the mid abdomen and radiated to the right lower quadrant.  Pain exacerbated by moving the abdominal wall.  No elevating factors.  She denies any fever.  At the ED she was found with tenderness to palpation in the right lower quadrant.  She was found with leukocytosis.  She had a CT scan of the abdomen and pelvis that shows a dilated appendix with appendicolith and surrounding fat stranding.  No free air or free fluid.  I personally evaluated the images.  Surgery is consulted by Dr. Jodie Echevaria in this context for evaluation and management of acute appendicitis.  PAST MEDICAL HISTORY (PMH):  Past Medical History:  Diagnosis Date   Anxiety    Arthritis    Bipolar 2 disorder (HCC)    Bipolar disorder (HCC)    Cervical cancer (HCC)    CTS (carpal tunnel syndrome)    CTS (carpal tunnel syndrome)    Diabetes mellitus without complication (HCC)    Environmental allergies    GERD (gastroesophageal reflux disease)    History of kidney stones    Hyperlipemia    Obesity    Pneumonia    Sleep apnea    cpap     PAST SURGICAL HISTORY (PSH):  Past Surgical History:  Procedure Laterality Date   ABDOMINAL HYSTERECTOMY     CARPAL TUNNEL RELEASE Right 04/03/2013   COLONOSCOPY WITH PROPOFOL N/A 11/18/2017   Procedure: COLONOSCOPY WITH PROPOFOL;  Surgeon: Scot Jun, MD;  Location: [facility] ENDOSCOPY;  Service: Endoscopy;  Laterality: N/A;   COLONOSCOPY WITH PROPOFOL N/A 05/31/2023   Procedure: COLONOSCOPY WITH PROPOFOL;  Surgeon: [name], DO;  Location: [facility] ENDOSCOPY;  Service: Gastroenterology;  Laterality: N/A;   DILATION AND CURETTAGE OF UTERUS     ESOPHAGOGASTRODUODENOSCOPY (EGD) WITH PROPOFOL N/A 07/13/2016   Procedure: ESOPHAGOGASTRODUODENOSCOPY (EGD) WITH PROPOFOL;   Surgeon: Scot Jun, MD;  Location: [facility] ENDOSCOPY;  Service: Endoscopy;  Laterality: N/A;   HEMORRHOID SURGERY N/A 11/09/2016   Procedure: HEMORRHOIDECTOMY;  Surgeon: Nadeen Landau, MD;  Location: [facility] ORS;  Service: General;  Laterality: N/A;   SHOULDER ARTHROSCOPY WITH SUBACROMIAL DECOMPRESSION, ROTATOR CUFF REPAIR AND BICEP TENDON REPAIR Right 01/24/2023   Procedure: SHOULDER ARTHROSCOPY WITH DEBRIDEMENT, DECOMPRESSION, EXCISION OF DISTAL CLAVICLE;  Surgeon: Christena Flake, MD;  Location: [facility] ORS;  Service: Orthopedics;  Laterality: Right;   TONSILLECTOMY     WISDOM TOOTH EXTRACTION       MEDICATIONS:  Prior to Admission medications   Medication Sig Start Date End Date Taking? Authorizing Provider  oxyCODONE (ROXICODONE) 5 MG immediate release tablet Take 1 tablet (5 mg total) by mouth every 6 (six) hours as needed for severe pain (pain score 7-10). 01/15/24   Agarwala, Anshul, MD  BIOTIN PO Take 1 capsule by mouth daily.    [provider]  CALCIUM-MAGNESIUM-VITAMIN D PO Take 1 tablet by mouth daily.    [provider]  Cholecalciferol (VITAMIN D3) 50 MCG (2000 UT) capsule Take 2,000 Units by mouth daily.    [provider]  Continuous Blood Gluc Sensor (FREESTYLE LIBRE 2 SENSOR) MISC USE 1 KIT EVERY 14 (FOURTEEN) DAYS FOR GLUCOSE MONITORING 05/30/22     Continuous Blood Gluc Sensor (FREESTYLE LIBRE 2 SENSOR) MISC Apply a new sensor every 14 (fourteen) days for glucose monitoring 11/16/22  Continuous Glucose Sensor (FREESTYLE LIBRE 2 SENSOR) MISC Inject 1 kit subcutaneously every 14 (fourteen) days for glucose monitoring 11/18/23     COVID-19 mRNA vaccine 2024-2025 (COMIRNATY) syringe Inject 0.3 mLs into the muscle. 09/17/23   Judyann Munson, MD  cyclobenzaprine (FLEXERIL) 5 MG tablet Take 1 tablet (5 mg total) by mouth at bedtime as needed. 02/13/24     erythromycin ophthalmic ointment Place into the left eye 3 (three) times daily for 10 days. 02/24/24      famotidine (PEPCID) 20 MG tablet Take 1 tablet (20 mg total) by mouth 2 (two) times daily as needed for heartburn. 02/25/23     famotidine (PEPCID) 20 MG tablet Take 1 tablet (20 mg total) by mouth daily. 05/10/23     hydrOXYzine (VISTARIL) 50 MG capsule Take 1 capsule (50 mg total) by mouth daily as needed. 06/27/23     hydrOXYzine (VISTARIL) 50 MG capsule Take 1 capsule (50 mg total) by mouth daily as needed. 02/13/24     latanoprost (XALATAN) 0.005 % ophthalmic solution Instill 1 drop into affected eye(s) once daily in the evening 09/26/22     latanoprost (XALATAN) 0.005 % ophthalmic solution Place 1 drop into affected eyes every evening. 10/02/23     latanoprost (XALATAN) 0.005 % ophthalmic solution Place 1 drop into affected eye(s) every evening. 11/06/23     linaclotide (LINZESS) 145 MCG CAPS capsule Take 1 capsule (145 mcg total) by mouth daily. Take 30 minutes before food. 11/21/23     lovastatin (MEVACOR) 20 MG tablet Take 1 tablet (20 mg total) by mouth daily. 11/18/23     lurasidone (LATUDA) 80 MG TABS tablet Take 1 tablet (80 mg total) by mouth daily with supper. 12/18/23     lurasidone (LATUDA) 80 MG TABS tablet Take 1 tablet (80 mg total) by mouth daily with supper. 02/13/24     Magnesium Glycinate 100 MG CAPS Take 100 mg by mouth at bedtime as needed. 09/20/23     mupirocin ointment (BACTROBAN) 2 % Apply 1 application topically to the affected area(s) once daily. 10/09/23   Elie Goody, MD  pantoprazole (PROTONIX) 40 MG tablet Take 1 tablet (40 mg total) by mouth once daily 11/16/22     pantoprazole (PROTONIX) 40 MG tablet Take 1 tablet (40 mg total) by mouth daily. 10/15/23     pneumococcal 20-valent conjugate vaccine (PREVNAR 20) 0.5 ML injection Inject into the muscle. 11/16/22   Judyann Munson, MD  tirzepatide [facility]) 5 MG/0.5ML Pen Inject 5 mg into the skin once a week. 05/10/23     tirzepatide (MOUNJARO) 5 MG/0.5ML Pen Inject 5 mg into the skin once a week. 11/18/23      traZODone (DESYREL) 100 MG tablet Take 1-2 tablets (100-200 mg total) by mouth at bedtime as needed for sleep. 02/13/24     traZODone (DESYREL) 150 MG tablet Take 1 tablet (150 mg total) by mouth at bedtime. 12/18/23        ALLERGIES:  Allergies  Allergen Reactions   Tramadol Rash and Hives    And shortness of breath   Lamictal [Lamotrigine] Rash   Latex Hives     SOCIAL HISTORY:  Social History   Socioeconomic History   Marital status: Married    Spouse name: Iantha Fallen   Number of children: 2   Years of education: Not on file   Highest education level: Not on file  Occupational History   Not on file  Tobacco Use   Smoking status: Some Days  Current packs/day: 1.00    Average packs/day: 1 pack/day for 10.0 years (10.0 ttl pk-yrs)    Types: Cigarettes   Smokeless tobacco: Never   Tobacco comments:    quit smoking "about 5-10 years ago" (06/12/2016)  Vaping Use   Vaping status: Never Used  Substance and Sexual Activity   Alcohol use: No   Drug use: No   Sexual activity: Not on file  Other Topics Concern   Not on file  Social History Narrative   Mother lives with her   Social Drivers of Health   Financial Resource Strain: High Risk (11/18/2023)   Received from [facility]   Overall Financial Resource Strain (CARDIA)    Difficulty of Paying Living Expenses: Hard  Food Insecurity: Food Insecurity Present (11/18/2023)   Received from [facility]   Hunger Vital Sign    Worried About Running Out of Food in the Last Year: Sometimes true    Ran Out of Food in the Last Year: Sometimes true  Transportation Needs: No Transportation Needs (11/18/2023)   Received from [facility] - Transportation    In the past 12 months, has lack of transportation kept you from medical appointments or from getting medications?: No    Lack of Transportation (Non-Medical): No  Physical Activity: Not on file  Stress: Not on  file  Social Connections: Not on file  Intimate Partner Violence: Not on file      FAMILY HISTORY:  Family History  Problem Relation Age of Onset   Diabetes Mother    Breast cancer Mother 78       s/p mastectomy   Diabetes Father    Colon polyps Father        approx 2 polyps   Breast cancer Maternal Aunt        dx. 39-40   Breast cancer Maternal Grandmother 89       dx. 64-65   Lung cancer Maternal Grandfather        d. 53; smoker   Cervical cancer Paternal Grandmother        d. 94 or older   Breast cancer Cousin    Lung cancer Other        maternal great uncle (MGM's brother); cigar smoker   Lung cancer Other        maternal great grandmother (MGM's mother); not a smoker     REVIEW OF SYSTEMS:  Constitutional: denies weight loss, fever, chills, or sweats  Eyes: denies any other vision changes, history of eye injury  ENT: denies sore throat, hearing problems  Respiratory: denies shortness of breath, wheezing  Cardiovascular: denies chest pain, palpitations  Gastrointestinal: positive abdominal pain, nausea and vomiting Genitourinary: denies burning with urination or urinary frequency Musculoskeletal: denies any other joint pains or cramps  Skin: denies any other rashes or skin discolorations  Neurological: denies any other headache, dizziness, weakness  Psychiatric: denies any other depression, anxiety   All other review of systems were negative   VITAL SIGNS:  Pulse Rate:  [77-110] 77 (03/18 0900) Resp:  [18-20] 18 (03/18 0900) BP: (122-170)/(79-88) 128/88 (03/18 0900) SpO2:  [96 %-100 %] 100 % (03/18 0900) Weight:  [108 kg] 108 kg (03/18 0630)     Height: 5\' 2"  (157.5 cm) Weight: 108 kg BMI (Calculated): 43.52   INTAKE/OUTPUT:  This shift: No intake/output data recorded.  Last 2 shifts: @IOLAST2SHIFTS @   PHYSICAL EXAM:  Constitutional:  -- Normal body habitus  --  Awake, alert, and oriented x3  Eyes:  -- Pupils equally round and reactive to light  -- No  scleral icterus  Ear, nose, and throat:  -- No jugular venous distension  Pulmonary:  -- No crackles  -- Equal breath sounds bilaterally -- Breathing non-labored at rest Cardiovascular:  -- S1, S2 present  -- No pericardial rubs Gastrointestinal:  -- Abdomen soft, tender to palpation in the right lower quadrant, non-distended, no guarding or rebound tenderness -- No abdominal masses appreciated, pulsatile or otherwise  Musculoskeletal and Integumentary:  -- Wounds or skin discoloration: None appreciated -- Extremities: B/L UE and LE FROM, hands and feet warm, no edema  Neurologic:  -- Motor function: intact and symmetric -- Sensation: intact and symmetric   Labs:     Latest Ref Rng & Units 03/17/2024    6:37 AM 01/03/2022    1:20 PM 11/09/2016    8:16 AM  CBC  WBC 4.0 - 10.5 K/uL 14.9  9.3    Hemoglobin 12.0 - 15.0 g/dL 57.8  46.9  62.9   Hematocrit 36.0 - 46.0 % 46.3  45.3  45.0   Platelets 150 - 400 K/uL 250  242        Latest Ref Rng & Units 03/17/2024    6:37 AM 01/03/2022    1:20 PM 11/09/2016    8:16 AM  CMP  Glucose 70 - 99 mg/dL 528  97  413   BUN 6 - 20 mg/dL 11  7    Creatinine 2.44 - 1.00 mg/dL 0.10  2.72    Sodium 536 - 145 mmol/L 138  141  142   Potassium 3.5 - 5.1 mmol/L 3.9  3.5  3.8   Chloride 98 - 111 mmol/L 109  105    CO2 22 - 32 mmol/L 21  28    Calcium 8.9 - 10.3 mg/dL 9.2  9.0    Total Protein 6.5 - 8.1 g/dL 7.2  7.1    Total Bilirubin 0.0 - 1.2 mg/dL 0.5  0.5    Alkaline Phos 38 - 126 U/L 84  62    AST 15 - 41 U/L 16  16    ALT 0 - 44 U/L 17  27     Imaging studies:  EXAM: CT ABDOMEN AND PELVIS WITH CONTRAST   TECHNIQUE: Multidetector CT imaging of the abdomen and pelvis was performed using the standard protocol following bolus administration of intravenous contrast.   RADIATION DOSE REDUCTION: This exam was performed according to the departmental dose-optimization program which includes automated exposure control, adjustment of the mA  and/or kV according to patient size and/or use of iterative reconstruction technique.   CONTRAST:  OMNIPAQUE IOHEXOL 300 MG/ML  SOLN   COMPARISON:  05/21/2016   FINDINGS: Lower chest: Linear subsegmental atelectasis scarring in the posterior basal segment right lower lobe.   Hepatobiliary: Unremarkable   Pancreas: Unremarkable   Spleen: Unremarkable   Adrenals/Urinary Tract: 2.1 by 1.7 cm right adrenal mass, internal density 17 Hounsfield units which is indeterminate. Previously there is only subtle nodularity in this vicinity.   Faint nodularity in the left adrenal gland without discrete left adrenal mass.   Two benign right renal cysts are present. Two sub-centimeter hypodense right renal lesions are technically too small to characterize although statistically likely to be small cysts. No further imaging workup of these lesions is indicated.   Urinary bladder unremarkable.   2 mm right kidney upper pole nonobstructive renal calculus, image 72 series 5.  Similar 2 mm left kidney upper pole nonobstructive renal calculus.   Stomach/Bowel: Postoperative findings along the anal canal.   Appendiceal diameter 1.1 cm, with high density material and gas scattered within the appendiceal lumen but no overt wall thickening in the appendix. This high density material may be from inspissated barium products (favored) or appendicolith. Subtle stranding in the adipose tissue adjacent to the proximal portion of the appendix for example on images 63 through 66 of series 2. The appendix was of normal caliber on the 05/21/2016 exam without visible appendicolith.   Fatty prominence of the ileocecal valve.   Vascular/Lymphatic: Unremarkable   Reproductive: Uterus absent.  Adnexa unremarkable.   Other: No supplemental non-categorized findings.   Musculoskeletal: Mild degenerative hip arthropathy, right greater than left.   IMPRESSION: 1. The appendix is dilated to 1.1 cm,  with high density material and gas scattered within the appendiceal lumen but no overt wall thickening in the appendix. This high density material may be from inspissated barium products (favored) or appendicolith. Subtle stranding in the adipose tissue adjacent to the proximal portion of the appendix. Although the normal wall thickness is not typical for acute appendicitis, the faint proximal periappendiceal stranding and dilated appearance of the appendix along with proximal possible appendicolith and the patient's new right lower quadrant pain are moderately suspicious of early acute appendicitis. 2. 2.1 by 1.7 cm right adrenal mass, internal density 17 Hounsfield units which is indeterminate. Previously there is only subtle nodularity in this vicinity. Although quite possibly due to a benign adenoma, adrenal protocol CT or MRI with and without contrast is recommended for definitive characterization. 3. Bilateral nonobstructive nephrolithiasis. 4. Mild degenerative hip arthropathy, right greater than left.     Electronically Signed   By: Gaylyn Rong M.D.   On: 03/17/2024 09:24  Assessment/Plan:  48 y.o. female with acute appendicitis.  Patient with history, physical exam and images consistent with acute appendicitis. Patient oriented about diagnosis and surgical management as treatment. Patient oriented about goals of surgery and its risk including: bowel injury, infection, abscess, bleeding, leak from cecum, intestinal adhesions, bowel obstruction, fistula, injury to the ureter among others.  Patient understood and agreed to proceed with surgery. Will admit patient, already started on antibiotic therapy, will give IV hydration since patient is NPO and schedule to OR.   Gae Gallop, MD

## 2024-03-18 ENCOUNTER — Encounter: Payer: Self-pay | Admitting: General Surgery

## 2024-03-18 LAB — SURGICAL PATHOLOGY

## 2024-05-03 ENCOUNTER — Other Ambulatory Visit: Payer: Self-pay

## 2024-05-04 ENCOUNTER — Other Ambulatory Visit: Payer: Self-pay

## 2024-05-05 ENCOUNTER — Other Ambulatory Visit: Payer: Self-pay

## 2024-05-05 MED ORDER — LINZESS 145 MCG PO CAPS
145.0000 ug | ORAL_CAPSULE | Freq: Every day | ORAL | 1 refills | Status: DC
Start: 2024-05-05 — End: 2024-07-14
  Filled 2024-05-05 (×2): qty 30, 30d supply, fill #0
  Filled 2024-06-12 – 2024-06-30 (×2): qty 30, 30d supply, fill #1

## 2024-05-06 ENCOUNTER — Other Ambulatory Visit: Payer: Self-pay

## 2024-05-06 MED ORDER — LINZESS 145 MCG PO CAPS
145.0000 ug | ORAL_CAPSULE | Freq: Every day | ORAL | 1 refills | Status: AC
  Filled 2024-05-31 – 2024-09-06 (×3): qty 90, 90d supply, fill #0
  Filled 2024-12-15 – 2024-12-24 (×2): qty 90, 90d supply, fill #1

## 2024-05-12 ENCOUNTER — Other Ambulatory Visit: Payer: Self-pay

## 2024-05-12 MED ORDER — LURASIDONE HCL 80 MG PO TABS
80.0000 mg | ORAL_TABLET | Freq: Every day | ORAL | 0 refills | Status: AC
  Filled 2024-05-12: qty 90, 90d supply, fill #0

## 2024-05-12 MED ORDER — TRAZODONE HCL 100 MG PO TABS
200.0000 mg | ORAL_TABLET | Freq: Every evening | ORAL | 0 refills | Status: AC
  Filled 2024-05-12: qty 180, 90d supply, fill #0

## 2024-05-12 MED ORDER — HYDROXYZINE PAMOATE 50 MG PO CAPS
50.0000 mg | ORAL_CAPSULE | Freq: Every day | ORAL | 0 refills | Status: AC | PRN
  Filled 2024-05-12: qty 90, 90d supply, fill #0

## 2024-06-01 ENCOUNTER — Other Ambulatory Visit: Payer: Self-pay

## 2024-06-03 ENCOUNTER — Other Ambulatory Visit: Payer: Self-pay

## 2024-06-04 ENCOUNTER — Other Ambulatory Visit: Payer: Self-pay

## 2024-06-04 MED ORDER — FAMOTIDINE 20 MG PO TABS
20.0000 mg | ORAL_TABLET | Freq: Every day | ORAL | 3 refills | Status: AC
  Filled 2024-06-04: qty 90, 90d supply, fill #0
  Filled 2024-07-13 – 2024-08-28 (×2): qty 90, 90d supply, fill #1
  Filled 2024-11-27: qty 90, 90d supply, fill #2

## 2024-06-05 ENCOUNTER — Other Ambulatory Visit: Payer: Self-pay

## 2024-06-08 ENCOUNTER — Other Ambulatory Visit: Payer: Self-pay

## 2024-06-08 MED ORDER — PREDNISONE 10 MG PO TABS
10.0000 mg | ORAL_TABLET | Freq: Every day | ORAL | 0 refills | Status: AC
  Filled 2024-06-08: qty 7, 7d supply, fill #0

## 2024-06-08 MED ORDER — NYSTATIN 100000 UNIT/GM EX CREA
1.0000 | TOPICAL_CREAM | Freq: Every day | CUTANEOUS | 2 refills | Status: AC | PRN
  Filled 2024-06-08: qty 30, 30d supply, fill #0
  Filled 2024-07-13: qty 30, 30d supply, fill #1
  Filled 2024-10-02: qty 30, 30d supply, fill #2

## 2024-06-25 ENCOUNTER — Other Ambulatory Visit: Payer: Self-pay

## 2024-06-30 ENCOUNTER — Other Ambulatory Visit: Payer: Self-pay

## 2024-07-13 ENCOUNTER — Other Ambulatory Visit: Payer: Self-pay

## 2024-07-14 ENCOUNTER — Other Ambulatory Visit: Payer: Self-pay

## 2024-07-14 ENCOUNTER — Other Ambulatory Visit (HOSPITAL_COMMUNITY): Payer: Self-pay

## 2024-07-14 MED ORDER — FREESTYLE LIBRE 3 PLUS SENSOR MISC
5 refills | Status: AC
  Filled 2024-07-14: qty 5, 75d supply, fill #0
  Filled 2024-07-14 (×2): qty 2, 30d supply, fill #0
  Filled 2024-08-12: qty 2, 30d supply, fill #1
  Filled 2024-09-06: qty 2, 30d supply, fill #2
  Filled 2024-11-27: qty 2, 30d supply, fill #3

## 2024-07-14 MED ORDER — METOPROLOL TARTRATE 25 MG PO TABS
12.5000 mg | ORAL_TABLET | Freq: Two times a day (BID) | ORAL | 1 refills | Status: AC
  Filled 2024-07-14 (×2): qty 90, 90d supply, fill #0
  Filled 2024-11-14: qty 90, 90d supply, fill #1

## 2024-07-15 ENCOUNTER — Other Ambulatory Visit: Payer: Self-pay

## 2024-07-17 ENCOUNTER — Other Ambulatory Visit: Payer: Self-pay

## 2024-07-17 MED ORDER — LINZESS 145 MCG PO CAPS
145.0000 ug | ORAL_CAPSULE | Freq: Every day | ORAL | 1 refills | Status: AC
  Filled 2024-07-17: qty 90, 90d supply, fill #0
  Filled 2024-08-12: qty 30, 30d supply, fill #0
  Filled 2024-11-16 – 2024-11-18 (×2): qty 30, 30d supply, fill #1
  Filled 2024-12-15 – 2024-12-24 (×2): qty 30, 30d supply, fill #2

## 2024-08-04 ENCOUNTER — Other Ambulatory Visit: Payer: Self-pay

## 2024-08-04 MED ORDER — CYCLOBENZAPRINE HCL 5 MG PO TABS
5.0000 mg | ORAL_TABLET | Freq: Every evening | ORAL | 0 refills | Status: DC | PRN
  Filled 2024-08-04: qty 9, 9d supply, fill #0
  Filled 2024-08-12: qty 12, 12d supply, fill #0
  Filled 2024-11-14: qty 9, 9d supply, fill #1

## 2024-08-06 ENCOUNTER — Other Ambulatory Visit: Payer: Self-pay

## 2024-08-12 ENCOUNTER — Other Ambulatory Visit: Payer: Self-pay

## 2024-08-14 ENCOUNTER — Other Ambulatory Visit: Payer: Self-pay

## 2024-08-14 MED ORDER — HYDROXYZINE PAMOATE 50 MG PO CAPS
50.0000 mg | ORAL_CAPSULE | Freq: Every day | ORAL | 0 refills | Status: AC | PRN
  Filled 2024-08-14: qty 90, 90d supply, fill #0

## 2024-08-14 MED ORDER — TRAZODONE HCL 100 MG PO TABS
100.0000 mg | ORAL_TABLET | Freq: Every evening | ORAL | 0 refills | Status: AC | PRN
  Filled 2024-08-14: qty 180, 90d supply, fill #0

## 2024-08-14 MED ORDER — LURASIDONE HCL 80 MG PO TABS
80.0000 mg | ORAL_TABLET | Freq: Every day | ORAL | 0 refills | Status: AC
  Filled 2024-08-14: qty 90, 90d supply, fill #0

## 2024-09-07 ENCOUNTER — Other Ambulatory Visit: Payer: Self-pay

## 2024-09-07 MED ORDER — METHYLPREDNISOLONE 4 MG PO TBPK
ORAL_TABLET | ORAL | 0 refills | Status: DC
  Filled 2024-09-07: qty 21, 6d supply, fill #0

## 2024-09-07 MED ORDER — MELOXICAM 15 MG PO TABS
15.0000 mg | ORAL_TABLET | Freq: Every day | ORAL | 1 refills | Status: AC
  Filled 2024-09-07: qty 30, 30d supply, fill #0
  Filled 2024-10-02: qty 30, 30d supply, fill #1

## 2024-09-07 MED ORDER — DICLOFENAC SODIUM 1 % EX GEL
2.0000 g | Freq: Three times a day (TID) | CUTANEOUS | 0 refills | Status: AC
  Filled 2024-09-07: qty 200, 30d supply, fill #0

## 2024-09-16 ENCOUNTER — Other Ambulatory Visit: Payer: Self-pay

## 2024-10-02 ENCOUNTER — Other Ambulatory Visit: Payer: Self-pay

## 2024-10-05 ENCOUNTER — Other Ambulatory Visit: Payer: Self-pay

## 2024-10-06 ENCOUNTER — Other Ambulatory Visit: Payer: Self-pay

## 2024-10-06 MED ORDER — PANTOPRAZOLE SODIUM 40 MG PO TBEC
40.0000 mg | DELAYED_RELEASE_TABLET | Freq: Every day | ORAL | 3 refills | Status: AC
  Filled 2024-10-06: qty 90, 90d supply, fill #0

## 2024-10-14 ENCOUNTER — Other Ambulatory Visit: Payer: Self-pay

## 2024-10-14 MED ORDER — FAMOTIDINE 20 MG PO TABS: 20.0000 mg | ORAL_TABLET | Freq: Every day | ORAL | 3 refills | Status: AC

## 2024-10-14 MED ORDER — LINZESS 145 MCG PO CAPS
145.0000 ug | ORAL_CAPSULE | Freq: Every day | ORAL | 3 refills | Status: AC
  Filled 2024-11-14: qty 90, 90d supply, fill #0

## 2024-10-14 MED ORDER — PANTOPRAZOLE SODIUM 40 MG PO TBEC
40.0000 mg | DELAYED_RELEASE_TABLET | Freq: Every day | ORAL | 3 refills | Status: AC
  Filled 2024-10-14 – 2025-02-01 (×2): qty 90, 90d supply, fill #0

## 2024-11-02 ENCOUNTER — Encounter: Payer: Self-pay | Admitting: Radiology

## 2024-11-12 ENCOUNTER — Other Ambulatory Visit (HOSPITAL_COMMUNITY): Payer: Self-pay

## 2024-11-14 ENCOUNTER — Other Ambulatory Visit: Payer: Self-pay

## 2024-11-16 ENCOUNTER — Other Ambulatory Visit: Payer: Self-pay

## 2024-11-17 ENCOUNTER — Other Ambulatory Visit: Payer: Self-pay

## 2024-11-18 ENCOUNTER — Other Ambulatory Visit: Payer: Self-pay

## 2024-11-19 ENCOUNTER — Other Ambulatory Visit: Payer: Self-pay

## 2024-11-20 ENCOUNTER — Other Ambulatory Visit: Payer: Self-pay

## 2024-11-20 MED ORDER — NEOMYCIN-POLYMYXIN-DEXAMETH 3.5-10000-0.1 OP SUSP
1.0000 [drp] | Freq: Three times a day (TID) | OPHTHALMIC | 0 refills | Status: AC
  Filled 2024-11-20: qty 5, 34d supply, fill #0

## 2024-11-20 MED ORDER — NEOMYCIN-POLYMYXIN-DEXAMETH 3.5-10000-0.1 OP OINT
TOPICAL_OINTMENT | OPHTHALMIC | 0 refills | Status: AC
  Filled 2024-11-20: qty 3.5, 14d supply, fill #0

## 2024-11-21 ENCOUNTER — Other Ambulatory Visit: Payer: Self-pay

## 2024-11-21 MED ORDER — LURASIDONE HCL 80 MG PO TABS: 80.0000 mg | ORAL_TABLET | Freq: Every day | ORAL | 0 refills | Status: AC

## 2024-11-21 MED ORDER — TRAZODONE HCL 100 MG PO TABS
100.0000 mg | ORAL_TABLET | Freq: Every evening | ORAL | 0 refills | Status: AC
  Filled 2024-11-27: qty 180, 90d supply, fill #0

## 2024-11-21 MED ORDER — HYDROXYZINE PAMOATE 50 MG PO CAPS
50.0000 mg | ORAL_CAPSULE | Freq: Every day | ORAL | 0 refills | Status: AC
  Filled 2024-11-27: qty 90, 90d supply, fill #0

## 2024-11-22 ENCOUNTER — Other Ambulatory Visit: Payer: Self-pay

## 2024-11-27 ENCOUNTER — Other Ambulatory Visit: Payer: Self-pay

## 2024-11-29 ENCOUNTER — Other Ambulatory Visit: Payer: Self-pay

## 2024-11-30 ENCOUNTER — Other Ambulatory Visit: Payer: Self-pay

## 2024-11-30 MED ORDER — LOVASTATIN 20 MG PO TABS
20.0000 mg | ORAL_TABLET | Freq: Every day | ORAL | 3 refills | Status: AC
  Filled 2024-11-30: qty 90, 90d supply, fill #0

## 2024-12-15 ENCOUNTER — Other Ambulatory Visit: Payer: Self-pay

## 2024-12-16 ENCOUNTER — Other Ambulatory Visit: Payer: Self-pay

## 2024-12-25 ENCOUNTER — Other Ambulatory Visit: Payer: Self-pay

## 2025-02-01 ENCOUNTER — Other Ambulatory Visit: Payer: Self-pay

## 2025-02-03 ENCOUNTER — Other Ambulatory Visit: Payer: Self-pay

## 2025-02-03 MED ORDER — TIRZEPATIDE 5 MG/0.5ML ~~LOC~~ SOAJ
5.0000 mg | SUBCUTANEOUS | 3 refills | Status: AC
  Filled 2025-02-03: qty 2, 28d supply, fill #0

## 2025-02-03 MED ORDER — CYCLOBENZAPRINE HCL 5 MG PO TABS
5.0000 mg | ORAL_TABLET | Freq: Every evening | ORAL | 0 refills | Status: AC | PRN
  Filled 2025-02-03: qty 30, 30d supply, fill #0
